# Patient Record
Sex: Male | Born: 1950 | Race: White | Hispanic: No | Marital: Married | Smoking: Never smoker
Health system: Southern US, Community
[De-identification: ages and names within clinical notes are randomized; demographics above are authoritative.]

## PROBLEM LIST (undated history)

## (undated) DIAGNOSIS — M1990 Unspecified osteoarthritis, unspecified site: Secondary | ICD-10-CM

## (undated) DIAGNOSIS — E785 Hyperlipidemia, unspecified: Secondary | ICD-10-CM

## (undated) DIAGNOSIS — Z0389 Encounter for observation for other suspected diseases and conditions ruled out: Secondary | ICD-10-CM

## (undated) DIAGNOSIS — I219 Acute myocardial infarction, unspecified: Secondary | ICD-10-CM

## (undated) DIAGNOSIS — I2510 Atherosclerotic heart disease of native coronary artery without angina pectoris: Principal | ICD-10-CM

## (undated) HISTORY — DX: Encounter for observation for other suspected diseases and conditions ruled out: Z03.89

## (undated) HISTORY — DX: Hyperlipidemia, unspecified: E78.5

## (undated) HISTORY — PX: HERNIA REPAIR: SHX51

## (undated) HISTORY — DX: Atherosclerotic heart disease of native coronary artery without angina pectoris: I25.10

---

## 2000-05-14 HISTORY — PX: LASIK: SHX215

## 2002-03-02 ENCOUNTER — Encounter: Admission: RE | Admit: 2002-03-02 | Discharge: 2002-03-02 | Payer: Self-pay | Admitting: Surgery

## 2002-03-02 ENCOUNTER — Encounter: Payer: Self-pay | Admitting: Surgery

## 2002-03-17 ENCOUNTER — Ambulatory Visit (HOSPITAL_BASED_OUTPATIENT_CLINIC_OR_DEPARTMENT_OTHER): Admission: RE | Admit: 2002-03-17 | Discharge: 2002-03-17 | Payer: Self-pay | Admitting: Surgery

## 2002-04-17 ENCOUNTER — Ambulatory Visit (HOSPITAL_COMMUNITY): Admission: RE | Admit: 2002-04-17 | Discharge: 2002-04-17 | Payer: Self-pay | Admitting: Surgery

## 2005-09-28 ENCOUNTER — Encounter: Admission: RE | Admit: 2005-09-28 | Discharge: 2005-09-28 | Payer: Self-pay | Admitting: Family Medicine

## 2007-01-16 ENCOUNTER — Emergency Department (HOSPITAL_COMMUNITY): Admission: EM | Admit: 2007-01-16 | Discharge: 2007-01-16 | Payer: Self-pay | Admitting: Emergency Medicine

## 2007-04-28 IMAGING — CR DG CHEST 2V
3 series · 3 of 3 positions shown · non-contrast
Comparison: None.

CLINICAL DATA: Cough.
 CHEST ? 2 VIEW:

[view not recorded (1 of 3)]
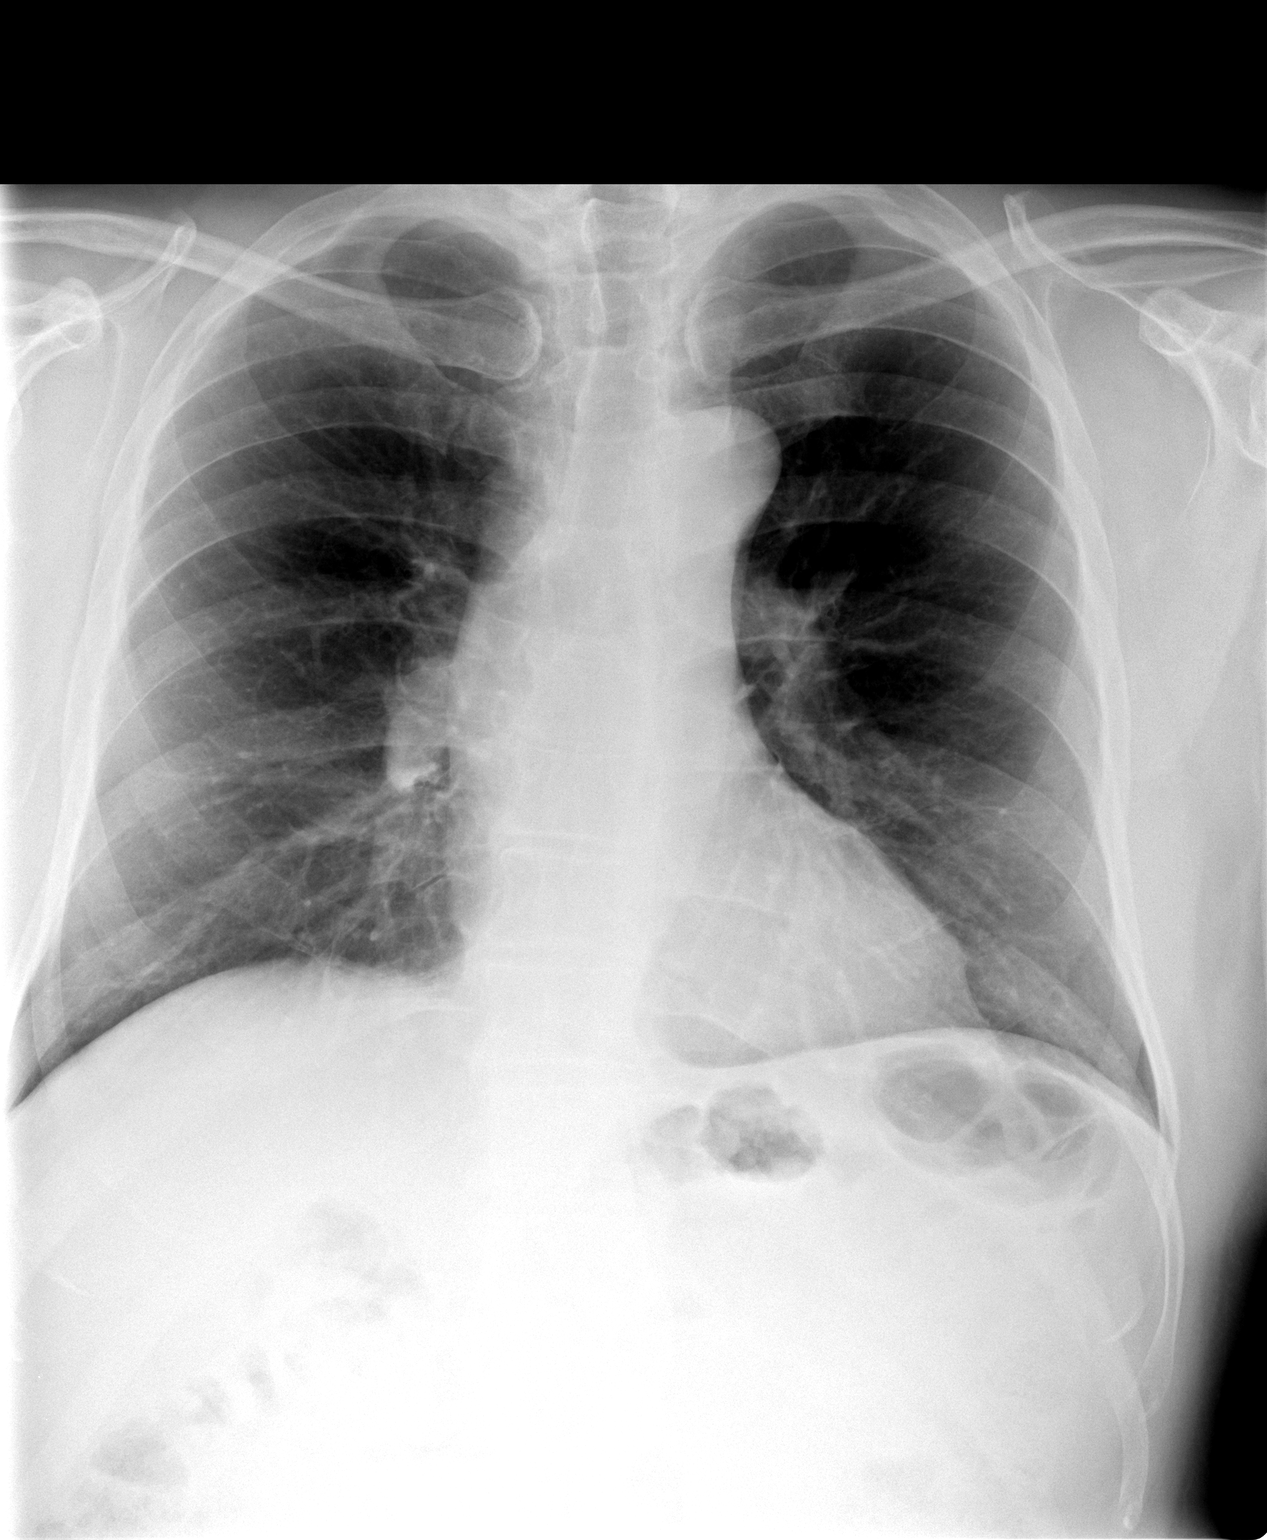

[view not recorded (2 of 3)]
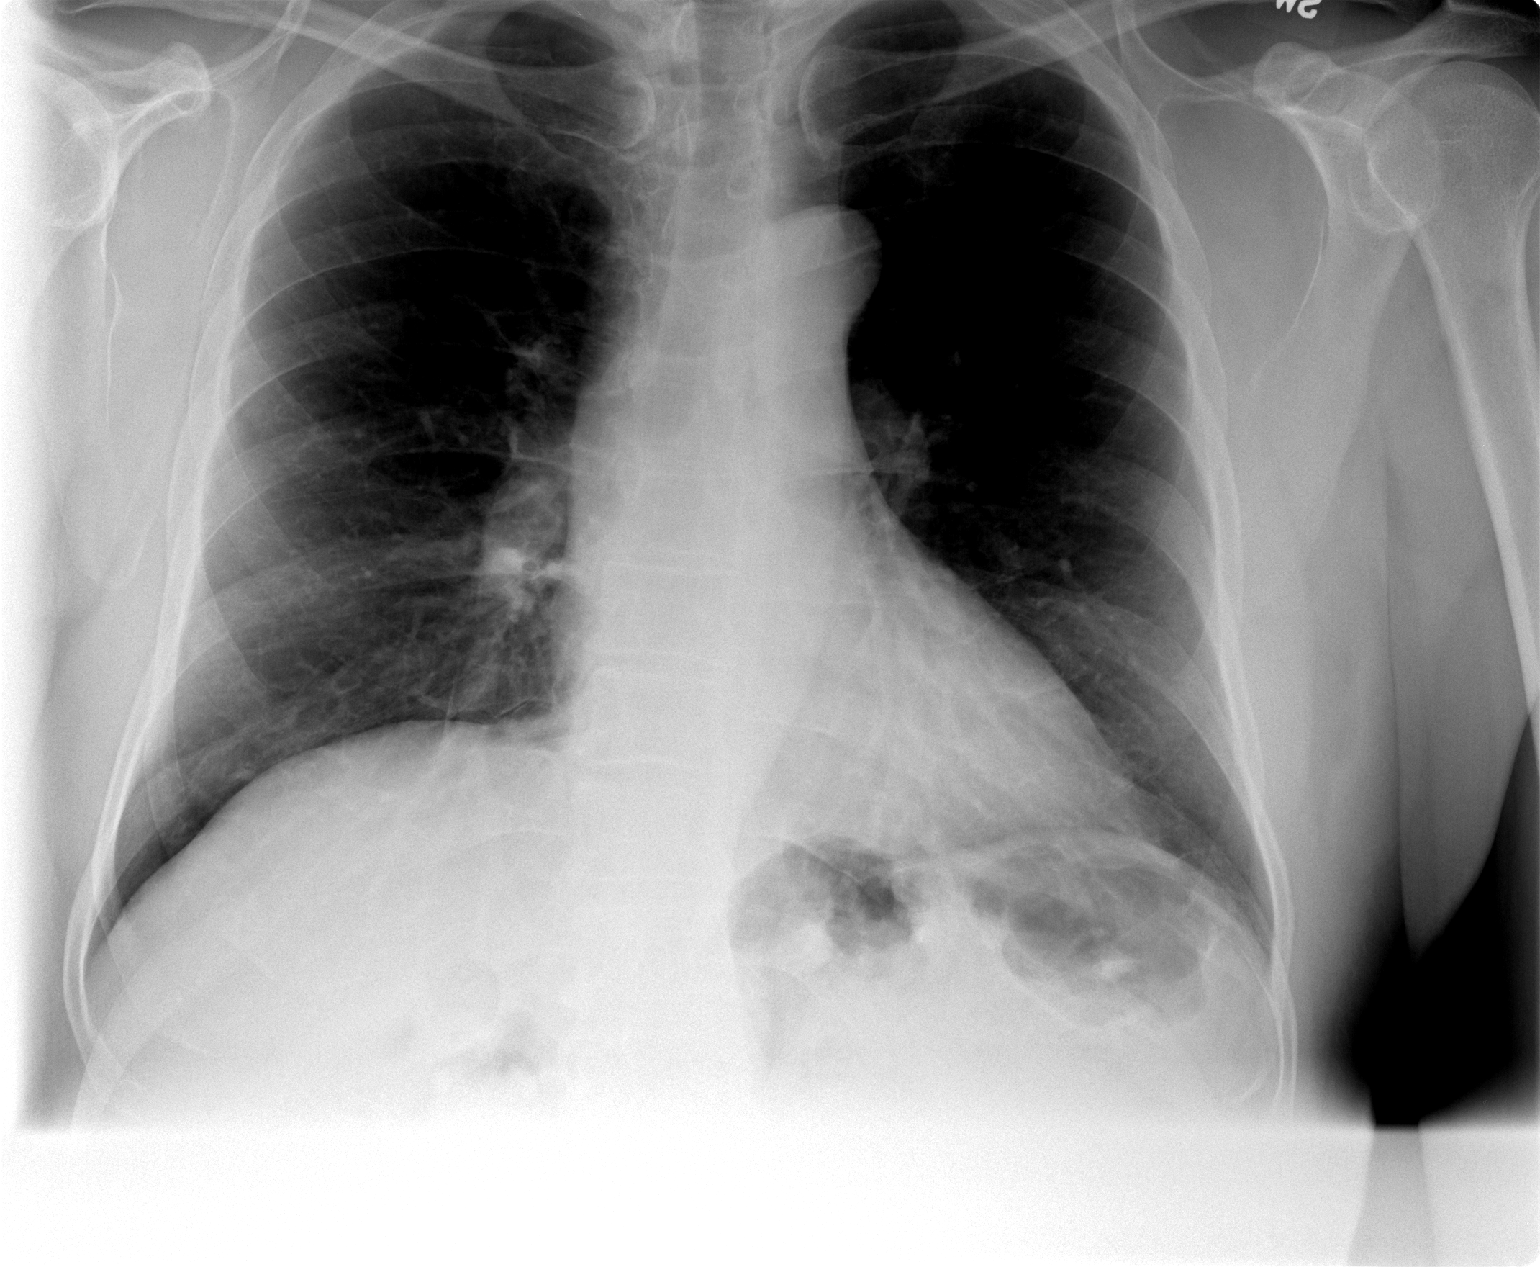

[view not recorded (3 of 3)]
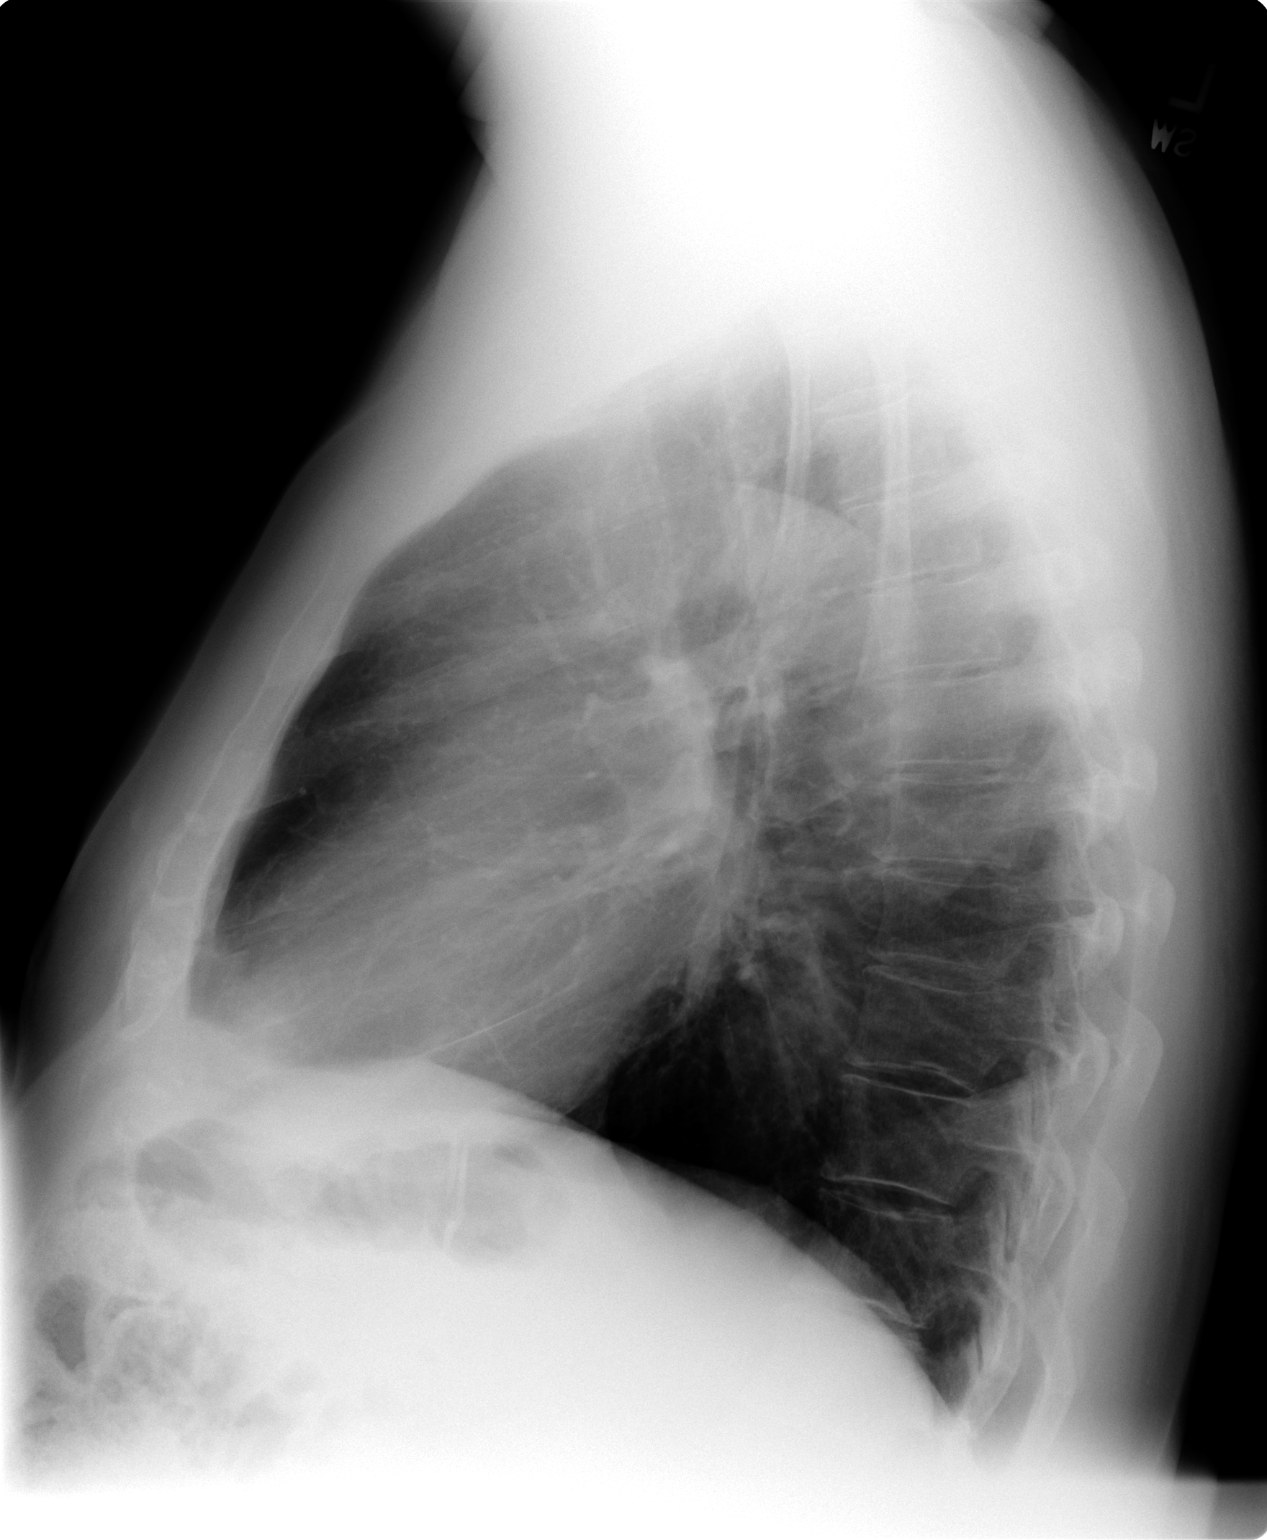

[3 of 3 positions shown; findings below may reference images not displayed]

The heart size and mediastinal contours are within normal limits.  Both lungs are clear.  The visualized skeletal structures are unremarkable.  Lungs appear somewhat hyperaerated.
IMPRESSION: Mild pulmonary hyperaeration.  No infiltrate.

## 2010-09-29 NOTE — Op Note (Signed)
NAME:  Dylan Johnson, Dylan Johnson                       ACCOUNT NO.:  [id number]   MEDICAL RECORD NO.:  [id number]                   PATIENT TYPE:  AMB   LOCATION:  DSC                                  FACILITY:  [facility]   PHYSICIAN:  Abigail Miyamoto, M.D.              DATE OF BIRTH:  14-Mar-1951   DATE OF PROCEDURE:  03/17/2002  DATE OF DISCHARGE:                                 OPERATIVE REPORT   PREOPERATIVE DIAGNOSES:  Right inguinal hernia.   POSTOPERATIVE DIAGNOSES:  Right inguinal hernia.   OPERATION PERFORMED:  Right inguinal hernia repair with mesh.   SURGEON:  Douglas A. Magnus Ivan, M.D.   ANESTHESIA:  General endotracheal and 0.25% Marcaine.   ESTIMATED BLOOD LOSS:  Minimal.   DESCRIPTION OF PROCEDURE:  The patient was brought to the operating room and  identified.  He was placed supine on the operating table and general  anesthesia was induced.  His right groin was then prepped and draped in the  usual sterile fashion.  An ilioinguinal nerve block was then performed with  0.25% Marcaine.  Next, a longitudinal incision was made in the right groin  with the 15 scalpel.  The incision was carried down through Scarpa's fascia  with electrocautery.  The external oblique fascia was then identified as  well as the external ring.  A small incision was then made with the scalpel  in the external oblique fascia.  The fascia was then opened further with the  Metzenbaum scissors.  ____________ cord and structures then identified and  controlled with a Penrose drain.  The inguinal floor was mildly weak.  No  other abnormalities were identified.  The cord was then examined and the  patient was found to have a small indirect hernia sac.  The sac was  separated from the cord structures.  It was opened and nothing was found to  be contained within the sac.  The sac was then tied off at the base with a 2-  0 silk suture.  The redundant sac was then excised.  The preperitoneal space  was then  dissected out with a Ray-Tec sponge.  A piece of Prolene mesh from  the [employer] Prolene hernia system was then brought on to the field.  The  preperitoneal portion of the mesh was then placed in the preperitoneal space  and unfolded.  The overlay portion was then opened up in the inguinal floor.  A small slit was then created in the mesh.  The mesh was then sewn in  circumferentially with interrupted 2-0 silk sutures sewing it to the pubic  tubercle, the transversalis fascia, the shelving edge of the inguinal  ligament and around the cord structures.  Excellent coverage of the inguinal  floor appeared to be achieved.  At this point the external oblique fascia  was closed with a running 2-0 Vicryl suture.  Scarpa's fascia was then  closed with interrupted 3-0 Vicryl  suture and the skin was closed with  running 4-0 Vicryl.  Steri-Strips, gauze and tape were then applied.  The  patient tolerated the procedure well.  All sponge, needle and instrument  counts were correct at the end of the procedure.  The patient was then  extubated in the operating room and taken in stable condition to the  recovery room.                                               Abigail Miyamoto, M.D.    DB/MEDQ  D:  03/17/2002  T:  03/17/2002  Job:  [id number]

## 2011-05-15 DIAGNOSIS — I219 Acute myocardial infarction, unspecified: Secondary | ICD-10-CM

## 2011-05-15 HISTORY — DX: Acute myocardial infarction, unspecified: I21.9

## 2011-06-10 ENCOUNTER — Inpatient Hospital Stay (HOSPITAL_COMMUNITY)
Admission: EM | Admit: 2011-06-10 | Discharge: 2011-06-12 | DRG: 853 | Disposition: A | Discharge status: Home / Self Care | Payer: BC Managed Care – PPO | Attending: Cardiology | Admitting: Cardiology

## 2011-06-10 ENCOUNTER — Ambulatory Visit (HOSPITAL_COMMUNITY): Admit: 2011-06-10 | Payer: Self-pay | Admitting: Cardiovascular Disease

## 2011-06-10 ENCOUNTER — Other Ambulatory Visit: Payer: Self-pay

## 2011-06-10 ENCOUNTER — Encounter (HOSPITAL_COMMUNITY): Payer: Self-pay | Admitting: *Deleted

## 2011-06-10 ENCOUNTER — Encounter (HOSPITAL_COMMUNITY)
Admission: EM | Disposition: A | Discharge status: Home / Self Care | Payer: Self-pay | Source: Home / Self Care | Attending: Cardiology

## 2011-06-10 ENCOUNTER — Emergency Department (HOSPITAL_COMMUNITY): Payer: BC Managed Care – PPO

## 2011-06-10 DIAGNOSIS — I2510 Atherosclerotic heart disease of native coronary artery without angina pectoris: Secondary | ICD-10-CM | POA: Diagnosis present

## 2011-06-10 DIAGNOSIS — Z79899 Other long term (current) drug therapy: Secondary | ICD-10-CM

## 2011-06-10 DIAGNOSIS — Z8249 Family history of ischemic heart disease and other diseases of the circulatory system: Secondary | ICD-10-CM

## 2011-06-10 DIAGNOSIS — R079 Chest pain, unspecified: Secondary | ICD-10-CM

## 2011-06-10 DIAGNOSIS — Z7982 Long term (current) use of aspirin: Secondary | ICD-10-CM

## 2011-06-10 DIAGNOSIS — Z7902 Long term (current) use of antithrombotics/antiplatelets: Secondary | ICD-10-CM

## 2011-06-10 DIAGNOSIS — I2109 ST elevation (STEMI) myocardial infarction involving other coronary artery of anterior wall: Principal | ICD-10-CM | POA: Diagnosis present

## 2011-06-10 HISTORY — DX: Unspecified osteoarthritis, unspecified site: M19.90

## 2011-06-10 HISTORY — PX: PERCUTANEOUS CORONARY STENT INTERVENTION (PCI-S): SHX5485

## 2011-06-10 HISTORY — PX: LEFT HEART CATHETERIZATION WITH CORONARY ANGIOGRAM: SHX5451

## 2011-06-10 HISTORY — PX: ANGIOPLASTY: SHX39

## 2011-06-10 HISTORY — DX: Atherosclerotic heart disease of native coronary artery without angina pectoris: I25.10

## 2011-06-10 LAB — POCT I-STAT TROPONIN I: Troponin i, poc: 0 ng/mL (ref 0.00–0.08)

## 2011-06-10 LAB — CBC
Hemoglobin: 15.6 g/dL (ref 13.0–17.0)
MCH: 29.4 pg (ref 26.0–34.0)
MCV: 84.9 fL (ref 78.0–100.0)
RBC: 5.31 MIL/uL (ref 4.22–5.81)

## 2011-06-10 LAB — CARDIAC PANEL(CRET KIN+CKTOT+MB+TROPI)
CK, MB: 85.6 ng/mL (ref 0.3–4.0)
Total CK: 924 U/L — ABNORMAL HIGH (ref 7–232)
Total CK: 1319 U/L — ABNORMAL HIGH (ref 7–232)
Troponin I: 14.19 ng/mL (ref <0.30)

## 2011-06-10 LAB — HEPATIC FUNCTION PANEL
ALT: 19 U/L (ref 0–53)
Total Protein: 7.5 g/dL (ref 6.0–8.3)

## 2011-06-10 LAB — BASIC METABOLIC PANEL
CO2: 26 mEq/L (ref 19–32)
Calcium: 9.6 mg/dL (ref 8.4–10.5)
Creatinine, Ser: 1.13 mg/dL (ref 0.50–1.35)
Glucose, Bld: 120 mg/dL — ABNORMAL HIGH (ref 70–99)

## 2011-06-10 LAB — POCT ACTIVATED CLOTTING TIME: Activated Clotting Time: 155 seconds

## 2011-06-10 LAB — PROTIME-INR: INR: 1.08 (ref 0.00–1.49)

## 2011-06-10 SURGERY — PERCUTANEOUS CORONARY STENT INTERVENTION (PCI-S)

## 2011-06-10 MED ORDER — ASPIRIN 81 MG PO CHEW: 324.0000 mg | CHEWABLE_TABLET | ORAL | Status: DC

## 2011-06-10 MED ORDER — NITROGLYCERIN 0.4 MG SL SUBL: 0.4000 mg | SUBLINGUAL_TABLET | SUBLINGUAL | Status: DC | PRN

## 2011-06-10 MED ORDER — MORPHINE SULFATE 2 MG/ML IJ SOLN: 1.0000 mg | INTRAMUSCULAR | Status: DC | PRN

## 2011-06-10 MED ORDER — ASPIRIN EC 81 MG PO TBEC: 81.0000 mg | DELAYED_RELEASE_TABLET | Freq: Every day | ORAL | Status: DC

## 2011-06-10 MED ORDER — NITROGLYCERIN 0.2 MG/ML ON CALL CATH LAB
INTRAVENOUS | Status: AC
  Filled 2011-06-10: qty 1

## 2011-06-10 MED ORDER — ACETAMINOPHEN 325 MG PO TABS: 650.0000 mg | ORAL_TABLET | ORAL | Status: DC | PRN

## 2011-06-10 MED ORDER — PRASUGREL HCL 10 MG PO TABS
60.0000 mg | ORAL_TABLET | ORAL | Status: AC
  Administered 2011-06-10: 60 mg via ORAL
  Filled 2011-06-10: qty 6

## 2011-06-10 MED ORDER — METOPROLOL TARTRATE 12.5 MG HALF TABLET
12.5000 mg | ORAL_TABLET | Freq: Two times a day (BID) | ORAL | Status: DC
  Administered 2011-06-10 – 2011-06-12 (×5): 12.5 mg via ORAL
  Filled 2011-06-10 (×6): qty 1

## 2011-06-10 MED ORDER — HEPARIN SODIUM (PORCINE) 5000 UNIT/ML IJ SOLN
4000.0000 [IU] | Freq: Once | INTRAMUSCULAR | Status: AC
  Administered 2011-06-10: 4000 [IU] via INTRAVENOUS

## 2011-06-10 MED ORDER — NITROGLYCERIN 0.4 MG SL SUBL
0.4000 mg | SUBLINGUAL_TABLET | SUBLINGUAL | Status: AC | PRN
  Administered 2011-06-10 (×3): 0.4 mg via SUBLINGUAL
  Filled 2011-06-10: qty 75

## 2011-06-10 MED ORDER — ONDANSETRON HCL 4 MG/2ML IJ SOLN: 4.0000 mg | Freq: Four times a day (QID) | INTRAMUSCULAR | Status: DC | PRN

## 2011-06-10 MED ORDER — ASPIRIN EC 325 MG PO TBEC
325.0000 mg | DELAYED_RELEASE_TABLET | Freq: Every day | ORAL | Status: DC
  Administered 2011-06-11: 325 mg via ORAL
  Filled 2011-06-10: qty 1

## 2011-06-10 MED ORDER — HEPARIN (PORCINE) IN NACL 2-0.9 UNIT/ML-% IJ SOLN
INTRAMUSCULAR | Status: AC
  Filled 2011-06-10: qty 2000

## 2011-06-10 MED ORDER — SODIUM CHLORIDE 0.9 % IV SOLN: INTRAVENOUS | Status: AC

## 2011-06-10 MED ORDER — HEPARIN SOD (PORCINE) IN D5W 100 UNIT/ML IV SOLN
1000.0000 [IU]/h | INTRAVENOUS | Status: DC
  Filled 2011-06-10: qty 250

## 2011-06-10 MED ORDER — LIDOCAINE HCL (PF) 1 % IJ SOLN
INTRAMUSCULAR | Status: AC
  Filled 2011-06-10: qty 30

## 2011-06-10 MED ORDER — NITROGLYCERIN 2 % TD OINT: 1.0000 [in_us] | TOPICAL_OINTMENT | Freq: Four times a day (QID) | TRANSDERMAL | Status: DC

## 2011-06-10 MED ORDER — METOPROLOL TARTRATE 25 MG PO TABS: 25.0000 mg | ORAL_TABLET | Freq: Two times a day (BID) | ORAL | Status: DC

## 2011-06-10 MED ORDER — ATROPINE SULFATE 1 MG/ML IJ SOLN
INTRAMUSCULAR | Status: AC
  Filled 2011-06-10: qty 1

## 2011-06-10 MED ORDER — PRASUGREL HCL 10 MG PO TABS
10.0000 mg | ORAL_TABLET | Freq: Every day | ORAL | Status: DC
  Administered 2011-06-11: 10 mg via ORAL
  Filled 2011-06-10: qty 1

## 2011-06-10 MED ORDER — ASPIRIN 300 MG RE SUPP: 300.0000 mg | RECTAL | Status: DC

## 2011-06-10 MED ORDER — BIVALIRUDIN 250 MG IV SOLR
INTRAVENOUS | Status: AC
  Filled 2011-06-10: qty 250

## 2011-06-10 MED ORDER — ASPIRIN 81 MG PO CHEW
324.0000 mg | CHEWABLE_TABLET | Freq: Once | ORAL | Status: AC
  Administered 2011-06-10: 162 mg via ORAL
  Filled 2011-06-10: qty 2

## 2011-06-10 MED ORDER — ROSUVASTATIN CALCIUM 40 MG PO TABS: 40.0000 mg | ORAL_TABLET | Freq: Every day | ORAL | Status: DC

## 2011-06-10 MED ORDER — MIDAZOLAM HCL 2 MG/2ML IJ SOLN
INTRAMUSCULAR | Status: AC
  Filled 2011-06-10: qty 2

## 2011-06-10 MED ORDER — NITROGLYCERIN IN D5W 200-5 MCG/ML-% IV SOLN: 3.0000 ug/min | INTRAVENOUS | Status: DC

## 2011-06-10 MED ORDER — ROSUVASTATIN CALCIUM 20 MG PO TABS
20.0000 mg | ORAL_TABLET | Freq: Every day | ORAL | Status: DC
  Administered 2011-06-10 – 2011-06-11 (×2): 20 mg via ORAL
  Filled 2011-06-10 (×3): qty 1

## 2011-06-10 MED ORDER — NITROGLYCERIN IN D5W 200-5 MCG/ML-% IV SOLN
2.0000 ug/min | Freq: Once | INTRAVENOUS | Status: AC
  Administered 2011-06-10: 5 ug/min via INTRAVENOUS
  Filled 2011-06-10: qty 250

## 2011-06-10 MED ORDER — SODIUM CHLORIDE 0.9 % IV SOLN
20.0000 mL | INTRAVENOUS | Status: DC
  Administered 2011-06-10: 20 mL via INTRAVENOUS

## 2011-06-10 NOTE — Progress Notes (Signed)
Cardiac cath sheath removed.  Pressure applied x 20 minutes with no signs of bleeding at termination of pressure.  Pressure dressing applied.  Pedal pulse +3.  VS stable.  Monitor shows SR.

## 2011-06-10 NOTE — ED Notes (Addendum)
Patient complaining of chest pain; states that the pain started around 8:30 pm, they went away, and then came back and awoke him from sleep around 1:30 am.  Describes location of chest pain as "mid-sternum"; states that the pain radiates all through his chest and down both arms.  Rates pain 8/10 on the numerical pain scale; describes pain as a "pressure".  Associated symptoms include hot flashes, nausea, and diaphoresis.  Patient denies dizziness, blurred vision, and numbness/tingling in hands/feet.  Denies cardiac history; patient had stress test year ago to gather baseline (no abnormal findings).  Patient took 162 mg of aspirin prior to arrival.  Patient alert and oriented x4; PERRL present. Upon arrival to room, patient connected to continuous cardiac, pulse ox, and blood pressure monitor.  Family present at bedside.  Will continue to monitor.

## 2011-06-10 NOTE — ED Notes (Signed)
Patient became diaphoretic and dizzy after second nitroglycerin tablet; withheld third nitro dose due to blood pressure dropping in the 80's systolic.  Patient given 500 mL bolus, placed cold washcloth on head, and placed patient in modified trendelenburg position -- patient states that his chest pain is subsiding, but that he still feels "flushed all over".  Dr. Lynelle Doctor notified; will continue to monitor.

## 2011-06-10 NOTE — H&P (Signed)
    Pt was reexamined and existing H & P reviewed. No changes found.  Runell Gess, MD [facility] 06/10/2011 6:24 AM

## 2011-06-10 NOTE — Progress Notes (Signed)
Subjective:  61 year old with distal RCA stent, Effient, ASA, [place]. Feels better. No SOB. Sheath still in place.  Had screening stress test 1.5 years ago which was normal.  Was at movie tonight around 830p developed substernal CP. Pain resolved and was able to finish the movie. Around 1:30 am woke with CP 7/10 and diaphoresis. Came to ER. First ECG with borderline changes suggestive of possible anterior injury current. First set CE normal. Treated with IV NTG with improvement in CP. However CP never resolved completely. F/u ECG mildly improved but borderline ST elevation in anterior leads. In [place] - prior to sched tonsillectomy - bleeding. Saw a hematologist. ?hematologic abnormality. Was told something about Factor "13". She had never seen this in her whole career. He has not had any bleeding issues since.   Objective:  Vital Signs in the last 24 hours: Temp:  [97.7 F (36.5 C)-98.2 F (36.8 C)] 98.2 F (36.8 C) (01/27 0700) Pulse Rate:  [49-70] 69  (01/27 0800) Resp:  [11-22] 17  (01/27 0800) BP: (106-149)/(60-100) 126/77 mmHg (01/27 0800) SpO2:  [97 %-100 %] 99 % (01/27 0800) Weight:  [90.719 kg (200 lb)] 90.719 kg (200 lb) (01/27 0500)  Intake/Output from previous day: 01/26 0701 - 01/27 0700 In: 75 [I.V.:75] Out: -    Physical Exam: General: Well developed, well nourished, in no acute distress. Head:  Normocephalic and atraumatic. Lungs: Clear to auscultation and percussion. Heart: Normal S1 and S2.  No murmur, rubs or gallops.  Pulses: Pulses normal in all 4 extremities. Abdomen: soft, non-tender, positive bowel sounds. Extremities: Right groin with oozing. Sheath in place. No edema. Neurologic: Alert and oriented x 3.    Lab Results:  [facility] 06/10/11 0243  WBC 8.2  HGB 15.6  PLT 215    Basename 06/10/11 0243  NA 136  K 3.6  CL 101  CO2 26  GLUCOSE 120*  BUN 16  CREATININE 1.13  Hepatic Function Panel  Basename 06/10/11 0243  PROT 7.5  ALBUMIN  3.9  AST 19  ALT 19  ALKPHOS 56  BILITOT 0.3  BILIDIR <0.1  IBILI NOT CALCULATED    Imaging: Dg Chest Portable 1 View  06/10/2011  *RADIOLOGY REPORT*  Clinical Data: Chest pain behind the sternum.  Nausea.  PORTABLE CHEST - 1 VIEW  Comparison: 09/28/2005  Findings: Borderline heart size with normal pulmonary vascularity, likely normal for technique.  No focal airspace consolidation in the lungs.  No pneumothorax.  No blunting of costophrenic angles. Tortuous aorta.  No significant change since previous study.  IMPRESSION: No evidence of active pulmonary disease.  Original Report Authenticated By: Marlon Pel, M.D.   Personally viewed.   Telemetry: Few PVC's. normal Personally viewed.   EKG:  Subtle J point elevation ant leads. Prior. This am, now, NSSTW changes in the inferior leads.  Cardiac Studies:  Cath reviewed 1. Left main; normal  2. LAD; minor irregularities to the apical LAD and the patient prior to the apex and was small  3. Left circumflex; 40% segmental stenosis in the mid AV groove just beyond a moderate-sized first marginal branch.  4. Right coronary artery; is a dominant vessel and with the culprit vessel. There were tandem 80 and 90% stenoses in the distal RCA at the crux just prior to the takeoff of a small PDA. There was a 40-50% stenosis at the genu. With what appears to be a RV branch that had a 95% ostial stenosis.  5. Left ventriculography; RAO  left ventriculogram was performed using  25 mL of Visipaque dye at 12 mL/second. The overall LVEF estimated  50 % With wall motion abnormalities significant for mild inferoapical hypokinesia  5 x 16 mm long Promus to distal dominant RCA    Assessment/Plan:  Active Problems:  Acute anterior wall MI  Acute MI - Successful PCI and stenting of the distal dominant right coronary artery using a Promus element drug-eluting stent. The patient has preserved LV function with wall motion abnormality in the corresponding to  the right coronary artery. We'll treat his acute marginal branch stenosis medically. He'll be treated with aspirin, effient. , beta blocker, and statin drugs. Crestor 20mg  added.  Watch for any signs of bleeding, ?hematologic history.  Effient for one year at least.   Family history of CAD - parents 24's MI.  If bed needed - OK to transfer to floor this evening if stable. Monitor CE's. Checking lipids.   Troponin thus far is normal. Good overall prognosis.   Critical care time - spent with family, discussion about disease course, labs, records.    Bevelyn Arriola 06/10/2011, 8:36 AM

## 2011-06-10 NOTE — ED Provider Notes (Signed)
History     CSN: [id number]  Arrival date & time 06/10/11  0156   First MD Initiated Contact with Patient 06/10/11 0226      Chief Complaint  Patient presents with  . Chest Pain    (Consider location/radiation/quality/duration/timing/severity/associated sxs/prior treatment) HPI Patient states he had an episode of chest discomfort early in the evening while they were at a movie  the that he attributed to heartburn. Symptoms resolved.  The patient states after falling asleep tonight however he woke up with chest discomfort.  Pain is located in the center of his chest. Patient states it is uncomfortable. He denies any shortness of breath or nausea.  The discomfort radiates from his chest into both arms. It's an 8/10 minutes described as a pressure. He has had some nausea and diaphoresis with it .  He does not have history of cardiac disease. He does have family history however in both his mother and father. He did have a stress test in the past that was normal. History reviewed. No pertinent past medical history.  History reviewed. No pertinent past surgical history.  History reviewed. No pertinent family history.  History  Substance Use Topics  . Smoking status: Never Smoker   . Smokeless tobacco: Not on file  . Alcohol Use: No      Review of Systems  Respiratory: Negative for cough.   Gastrointestinal: Negative for abdominal pain.  All other systems reviewed and are negative.    Allergies  Review of patient's allergies indicates no known allergies.  Home Medications   Current Outpatient Rx  Name Route Sig Dispense Refill  . ASPIRIN EC 81 MG PO TBEC Oral Take 81 mg by mouth once.      BP 140/98  Pulse 65  Temp(Src) 97.7 F (36.5 C) (Oral)  Resp 16  SpO2 100%  Physical Exam  Nursing note and vitals reviewed. Constitutional: He appears well-developed and well-nourished. No distress.  HENT:  Head: Normocephalic and atraumatic.  Right Ear: External ear normal.    Left Ear: External ear normal.  Eyes: Conjunctivae are normal. Right eye exhibits no discharge. Left eye exhibits no discharge. No scleral icterus.  Neck: Neck supple. No tracheal deviation present.  Cardiovascular: Normal rate, regular rhythm and intact distal pulses.   Pulmonary/Chest: Effort normal and breath sounds normal. No stridor. No respiratory distress. He has no wheezes. He has no rales.  Abdominal: Soft. Bowel sounds are normal. He exhibits no distension. There is no tenderness. There is no rebound and no guarding.  Musculoskeletal: He exhibits no edema and no tenderness.  Neurological: He is alert. He has normal strength. No sensory deficit. Cranial nerve deficit:  no gross defecits noted. He exhibits normal muscle tone. He displays no seizure activity. Coordination normal.  Skin: Skin is warm and dry. No rash noted.  Psychiatric: He has a normal mood and affect.    ED Course  Procedures (including critical care time)  Date: 06/10/2011  Rate: 71  Rhythm: normal sinus rhythm  QRS Axis: normal  Intervals: normal  ST/T Wave abnormalities: normal accepts increased T-wave amplitude anterior leads which are new  Conduction Disutrbances:none  Narrative Interpretation:   Old EKG Reviewed: changes noted  Labs Reviewed  BASIC METABOLIC PANEL - Abnormal; Notable for the following:    Glucose, Bld 120 (*)    GFR calc non Af Amer 69 (*)    GFR calc Af Amer 80 (*)    All other components within normal limits  CBC  PROTIME-INR  APTT  POCT I-STAT TROPONIN I  HEPATIC FUNCTION PANEL  I-STAT TROPONIN I  I-STAT TROPONIN I  I-STAT TROPONIN I  I-STAT TROPONIN I  HEPATIC FUNCTION PANEL   No results found.   Medications  aspirin EC 81 MG tablet (not administered)  0.9 %  sodium chloride infusion (20 mL Intravenous New Bag/Given 06/10/11 0331)  nitroGLYCERIN (NITROGLYN) 2 % ointment 1 inch (not administered)  nitroGLYCERIN 0.2 mg/mL in dextrose 5 % infusion (not administered)   aspirin chewable tablet 324 mg (162 mg Oral Given 06/10/11 0312)  nitroGLYCERIN (NITROSTAT) SL tablet 0.4 mg (0.4 mg Sublingual Given 06/10/11 0326)    The patient's symptoms are concerning for acute coronary spasm. He has some EKG changes that are new compared to his old EKG on file. Patient has been given aspirin, nitroglycerin. I will also start him on heparin. He had a transient episode of hypotension associated with nitroglycerin but that is better now. Patient states she's feeling significantly better although does have some chest discomfort still. Will continue nitroglycerin. I've spoken with Dr. Teressa Lower who is on call for cardiology. He will come to evaluate the patient we will continue to monitor closely in the emergency department.   CRITICAL CARE Performed by: Celene Kras   Total critical care time: 30  Critical care time was exclusive of separately billable procedures and treating other patients.  Critical care was necessary to treat or prevent imminent or life-threatening deterioration.  Critical care was time spent personally by me on the following activities: development of treatment plan with patient and/or surrogate as well as nursing, discussions with consultants, evaluation of patient's response to treatment, examination of patient, obtaining history from patient or surrogate, ordering and performing treatments and interventions, ordering and review of laboratory studies, ordering and review of radiographic studies, pulse oximetry and re-evaluation of patient's condition.         Celene Kras, MD 06/10/11 [phone]

## 2011-06-10 NOTE — ED Notes (Signed)
Patient transported to cath lab; patient on [facility]; transported by RNs and critical care RNs.

## 2011-06-10 NOTE — ED Notes (Signed)
X-ray at bedside

## 2011-06-10 NOTE — ED Notes (Signed)
Dr.Knapp at bedside  

## 2011-06-10 NOTE — ED Notes (Signed)
EKG completed and given to Dr. Knapp along with OLD ekg. 

## 2011-06-10 NOTE — ED Notes (Signed)
The pt has had chest pain tonight.  He feel asleep then woke up with the chest pain again

## 2011-06-10 NOTE — H&P (Signed)
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 Strong family Hx of CAD with both parents dying of heart disease.  Had screening stress test 1.5 years ago which was normal.   Was at movie tonight around 830p developed substernal CP. Pain resolved and was able to finish the movie. Around 1:30 am woke with CP 7/10 and diaphoresis. Came to ER. First ECG with borderline changes suggestive of possible anterior injury current. First set CE normal. Treated with IV NTG with improvement in CP. However CP never resolved completely. F/u ECG mildly improved but borderline ST elevation in anterior leads.  Treated with ASA, heparin and NTG.   Denies bleeding/fevers/chills/HF.    Review of Systems:     Cardiac Review of Systems: {Y] = yes [ ]  = no  Chest Pain [  Y  ]  Resting SOB [   ] Exertional SOB  [  ]  Orthopnea [  ]   Pedal Edema [   ]    Palpitations [  ] Syncope  [  ]   Presyncope [   ]  General Review of Systems: [Y] = yes [  ]=no Constitional: recent weight change [  ]; anorexia [  ]; fatigue [  ]; nausea [  ]; night sweats [  ]; fever [  ]; or chills [  ];                                                                                                                                          Eye : blurred vision [  ]; diplopia [   ]; vision changes [  ];  Amaurosis fugax[  ]; Resp: cough [  ];  wheezing[  ];  hemoptysis[  ]; shortness of breath[  ]; paroxysmal nocturnal dyspnea[  ]; dyspnea on exertion[  ]; or orthopnea[  ];  GI:  gallstones[  ], vomiting[  ];  dysphagia[  ]; melena[  ];  hematochezia [  ]; heartburn[  ];   Hx of  Colonoscopy[  ]; GU: kidney stones [  ]; hematuria[  ];   dysuria [  ];  nocturia[  ];  history of     obstruction [  ];                 Skin: rash, swelling[  ];, hair loss[  ];  peripheral edema[  ];  or itching[  ]; Musculosketetal: myalgias[  ];  joint swelling[  ];  joint erythema[  ];  joint pain[  ];  back pain[  ];  Heme/Lymph:  bruising[  ];  bleeding[  ];  anemia[  ];  Neuro: TIA[  ];  headaches[  ];  stroke[  ];  vertigo[  ];  seizures[  ];   paresthesias[  ];  difficulty walking[  ];  Psych:depression[  ]; anxiety[  ];  Endocrine: diabetes[  ];  thyroid dysfunction[  ];   Other:  History reviewed. No pertinent past medical history.  Medications Prior to Admission  Medication Dose Route Frequency Provider Last Rate Last Dose  . 0.9 %  sodium chloride infusion  20 mL Intravenous Continuous Celene Kras, MD 20 mL/hr at 06/10/11 0331 20 mL at 06/10/11 0331  . aspirin chewable tablet 324 mg  324 mg Oral Once Celene Kras, MD   162 mg at 06/10/11 1610  . nitroGLYCERIN (NITROSTAT) SL tablet 0.4 mg  0.4 mg Sublingual Q5 min PRN Celene Kras, MD   0.4 mg at 06/10/11 0326  . nitroGLYCERIN 0.2 mg/mL in dextrose 5 % infusion  2-200 mcg/min Intravenous Once Celene Kras, MD 1.5 mL/hr at 06/10/11 0358 5 mcg/min at 06/10/11 0358  . DISCONTD: nitroGLYCERIN (NITROGLYN) 2 % ointment 1 inch  1 inch Topical Q6H Celene Kras, MD       No current outpatient prescriptions on file as of 06/10/2011.     No Known Allergies  History   Social History  . Marital Status: Married    Spouse Name: N/A    Number of Children: N/A  . Years of Education: N/A   Occupational History  . Not on file.   Social History Main Topics  . Smoking status: Never Smoker   . Smokeless tobacco: Not on file  . Alcohol Use: No  . Drug Use:   . Sexually Active:    Other Topics Concern  . Not on file   Social History Narrative  . No narrative on file    History reviewed. No pertinent family history.  PHYSICAL EXAM: Filed Vitals:   06/10/11 0400  BP: 116/75  Pulse: 59  Temp:   Resp: 12   General:  Well appearing but mildly uncomfortable No respiratory difficulty HEENT: normal Neck: supple. no JVD. Carotids 2+ bilat; no bruits. No lymphadenopathy or thryomegaly appreciated. Cor: PMI nondisplaced. Regular rate & rhythm. No rubs, gallops or  murmurs. Lungs: clear Abdomen: soft, nontender, nondistended. No hepatosplenomegaly. No bruits or masses. Good bowel sounds. Extremities: no cyanosis, clubbing, rash, edema Neuro: alert & oriented x 3, cranial nerves grossly intact. moves all 4 extremities w/o difficulty. Affect pleasant.  ECG: NSR 71 with borderline ST elevation anteriorly  Results for orders placed during the hospital encounter of 06/10/11 (from the past 24 hour(s))  CBC     Status: Normal   Collection Time   06/10/11  2:43 AM      Component Value Range   WBC 8.2  4.0 - 10.5 (K/uL)   RBC 5.31  4.22 - 5.81 (MIL/uL)   Hemoglobin 15.6  13.0 - 17.0 (g/dL)   HCT 96.0  45.4 - 09.8 (%)   MCV 84.9  78.0 - 100.0 (fL)   MCH 29.4  26.0 - 34.0 (pg)   MCHC 34.6  30.0 - 36.0 (g/dL)   RDW 11.9  14.7 - 82.9 (%)   Platelets 215  150 - 400 (K/uL)  BASIC METABOLIC PANEL     Status: Abnormal   Collection Time   06/10/11  2:43 AM      Component Value Range   Sodium 136  135 - 145 (mEq/L)   Potassium 3.6  3.5 - 5.1 (mEq/L)   Chloride 101  96 - 112 (mEq/L)   CO2 26  19 - 32 (mEq/L)   Glucose, Bld 120 (*) 70 - 99 (mg/dL)   BUN 16  6 - 23 (  mg/dL)   Creatinine, Ser 9.81  0.50 - 1.35 (mg/dL)   Calcium 9.6  8.4 - 19.1 (mg/dL)   GFR calc non Af Amer 69 (*) >90 (mL/min)   GFR calc Af Amer 80 (*) >90 (mL/min)  HEPATIC FUNCTION PANEL     Status: Normal   Collection Time   06/10/11  2:43 AM      Component Value Range   Total Protein 7.5  6.0 - 8.3 (g/dL)   Albumin 3.9  3.5 - 5.2 (g/dL)   AST 19  0 - 37 (U/L)   ALT 19  0 - 53 (U/L)   Alkaline Phosphatase 56  39 - 117 (U/L)   Total Bilirubin 0.3  0.3 - 1.2 (mg/dL)   Bilirubin, Direct <4.7  0.0 - 0.3 (mg/dL)   Indirect Bilirubin NOT CALCULATED  0.3 - 0.9 (mg/dL)  POCT I-STAT TROPONIN I     Status: Normal   Collection Time   06/10/11  2:57 AM      Component Value Range   Troponin i, poc 0.00  0.00 - 0.08 (ng/mL)   Comment 3           PROTIME-INR     Status: Normal   Collection Time     06/10/11  3:02 AM      Component Value Range   Prothrombin Time 14.2  11.6 - 15.2 (seconds)   INR 1.08  0.00 - 1.49   APTT     Status: Normal   Collection Time   06/10/11  3:02 AM      Component Value Range   aPTT 35  24 - 37 (seconds)   Dg Chest Portable 1 View  06/10/2011  *RADIOLOGY REPORT*  Clinical Data: Chest pain behind the sternum.  Nausea.  PORTABLE CHEST - 1 VIEW  Comparison: 09/28/2005  Findings: Borderline heart size with normal pulmonary vascularity, likely normal for technique.  No focal airspace consolidation in the lungs.  No pneumothorax.  No blunting of costophrenic angles. Tortuous aorta.  No significant change since previous study.  IMPRESSION: No evidence of active pulmonary disease.  Original Report Authenticated By: Marlon Pel, M.D.     ASSESSMENT:  1. CP/USA   PLAN/DISCUSSION:  ECG changes are borderline but suggest possible anterior injury current. Given ongoing symptoms, will proceed with urgent cath. I discussed this with him and his family and he is willing to proceed. CODE STEMI activated. Will give Effient 60 mg load now.   Lenus Ryken Paschal,MD 4:40 AM

## 2011-06-10 NOTE — ED Notes (Signed)
Cath lab called; states that they are ready for patient.  Preparing patient for transport. 

## 2011-06-10 NOTE — Op Note (Signed)
Dylan Johnson is a 61 y.o. male    [id number] LOCATION:  FACILITY: [facility]  PHYSICIAN: Nanetta Batty, M.D. [birth year]   DATE OF PROCEDURE:  06/10/2011  DATE OF DISCHARGE:  [facility]  CARDIAC CATHETERIZATION     History obtained from chart review. 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 Strong family Hx of CAD with both parents dying of heart disease.  Had screening stress test 1.5 years ago which was normal.  Was at movie tonight around 830p developed substernal CP. Pain resolved and was able to finish the movie. Around 1:30 am woke with CP 7/10 and diaphoresis. Came to ER. First ECG with borderline changes suggestive of possible anterior injury current. First set CE normal. Treated with IV NTG with improvement in CP. However CP never resolved completely. F/u ECG mildly improved but borderline ST elevation in anterior leads.    PROCEDURE DESCRIPTION:    The patient was brought to the second floor  [facility] lab in the postabsorptive state. He was  premedicated with 0.5 mg of Versed IV. His right groin was prepped and shaved in usual sterile fashion. Xylocaine 1% was used  for local anesthesia. A 6 French sheath was inserted into the right common femoral  artery using standard Seldinger technique. The 6 French right and left Judkins diagnostic catheters along with a 6 French pigtail catheter were used for selective coronary angiography and left ventriculography respectively. Visipaque dye was used for the entirety of the case. Retrograde aortic, left ventricular end pullback pressures were recorded.    HEMODYNAMICS:    AO SYSTOLIC/AO DIASTOLIC: 140/80   LV SYSTOLIC/LV DIASTOLIC: 146/16  ANGIOGRAPHIC RESULTS:   1. Left main; normal  2. LAD; minor irregularities to the apical LAD and the patient prior to the apex and was small 3. Left circumflex; 40% segmental stenosis in the mid AV groove just beyond a moderate-sized first  marginal branch.  4. Right coronary artery; is a dominant vessel and with the culprit vessel. There were tandem 80 and 90% stenoses in the distal RCA at the crux just prior to the takeoff of a small PDA. There was a 40-50% stenosis at the genu. With what appears to be a RV branch that had a 95% ostial stenosis. 5. Left ventriculography; RAO left ventriculogram was performed using  25 mL of Visipaque dye at 12 mL/second. The overall LVEF estimated  50 %  With wall motion abnormalities significant for mild inferoapical hypokinesia.  IMPRESSION:Dylan Johnson. Has unstable angina with suggestive EKG changes in the anterior leads though his him angiogram revealed high-grade distal dominant RCA stenosis. We'll proceed with PCI and stenting of his distal right coronary artery using a drug-eluting stent, Angiomax and effient.  Procedure description:. The patient received Intimax bolus and ACT of 457. Symmetric the patient was 150 cc. Because of the upgoing gait nature of the origin of the dominant right coronary artery a 6 French shepherd's crook guide catheter was used. A 190 cm length 014 a saw he soft guidewire was used along with a 2.0 x 12 mm long immerge angioplasty balloon. Predilatation was performed at nominal pressures. Following this a 5 x 16 mm long Promus element juggling stent was then carefully positioned and deployed at 16 atmospheres. Following this post dilatation was performed with a 2.75 mm x 12 millimeter long [place] trekat 16 atmospheres (2.8 mm) resulting reduction of a 90% stenosis to 0% residual without dissection. 200 mcg of intra-coronary nitroglycerin which was administered.  There was spasm in the proximal portion which resolved. The final antibiotic result result with reduction of a distal 90% dominant RCA stenosis to 0% residual without dissection and TIMI-3 flow. The patient still had residual chest at the end of the case. I elected not to intervene on the RV/acute marginal branch because  of its size.  Final impression: Successful PCI and stenting of the distal dominant right coronary artery using a Promus element drug-eluting stent. The patient has preserved LV function with wall motion abnormality in the corresponding to the right coronary artery. We'll treat his acute marginal branch stenosis medically. He'll be treated with aspirin, effient. , beta blocker, it and statin drugs. He'll be fast tracked and probably be discharged home in the next 48 hours. The sheath was then securely in place. The patient left the lab in stable condition.  Runell Gess MD, [facility] 06/10/2011 6:30 AM

## 2011-06-10 NOTE — Progress Notes (Signed)
Chaplain's Note:  Responded to code stemi page.  Prayed with pt and family in ED before transport to cath lab.  Accompanied family to waiting room and provided emotional and spiritual support.  Explained to family where pt would be taken after procedure.  Will continue to follow as needed or requested. [employer] [phone]

## 2011-06-10 NOTE — Progress Notes (Signed)
Short burst of v-tach noted while pt up to bathroom.  Pt asymptomatic and spontaneously resolved.

## 2011-06-10 NOTE — ED Notes (Signed)
Patient currently resting quietly in bed; no respiratory or acute distress noted.  Patient updated on plan of care; informed patient that we are currently waiting on cath lab to call and say that they are ready.  Patient has no other questions or concerns at this time.  Family present at bedside.  Will continue to monitor.

## 2011-06-11 LAB — CBC
HCT: 44.7 % (ref 39.0–52.0)
Hemoglobin: 14.9 g/dL (ref 13.0–17.0)
MCV: 86.1 fL (ref 78.0–100.0)
WBC: 8.9 10*3/uL (ref 4.0–10.5)

## 2011-06-11 LAB — BASIC METABOLIC PANEL
BUN: 14 mg/dL (ref 6–23)
Chloride: 105 mEq/L (ref 96–112)
GFR calc Af Amer: >90 mL/min (ref >90)
Glucose, Bld: 108 mg/dL — ABNORMAL HIGH (ref 70–99)
Potassium: 3.8 mEq/L (ref 3.5–5.1)

## 2011-06-11 LAB — LIPID PANEL
HDL: 35 mg/dL — ABNORMAL LOW (ref >39)
LDL Cholesterol: 141 mg/dL — ABNORMAL HIGH (ref 0–99)
Triglycerides: 121 mg/dL (ref <150)
VLDL: 24 mg/dL (ref 0–40)

## 2011-06-11 LAB — CARDIAC PANEL(CRET KIN+CKTOT+MB+TROPI): Total CK: 926 U/L — ABNORMAL HIGH (ref 7–232)

## 2011-06-11 MED ORDER — ASPIRIN EC 325 MG PO TBEC
325.0000 mg | DELAYED_RELEASE_TABLET | Freq: Every day | ORAL | Status: DC
  Administered 2011-06-12: 325 mg via ORAL
  Filled 2011-06-11: qty 1

## 2011-06-11 MED ORDER — PRASUGREL HCL 10 MG PO TABS
10.0000 mg | ORAL_TABLET | Freq: Every day | ORAL | Status: DC
  Administered 2011-06-12: 10 mg via ORAL
  Filled 2011-06-11: qty 1

## 2011-06-11 MED FILL — Dextrose Inj 5%: INTRAVENOUS | Qty: 50 | Status: AC

## 2011-06-11 NOTE — Progress Notes (Signed)
Subjective:  61 year old with AMI.  Yesterday few beats of NSVT, AIVR. No symptoms, no SOB. Abmulating.   Objective:  Vital Signs in the last 24 hours: Temp:  [98 F (36.7 C)-99.5 F (37.5 C)] 98 F (36.7 C) (01/28 0715) Pulse Rate:  [57-71] 68  (01/27 2210) Resp:  [11-21] 14  (01/28 0715) BP: (111-137)/(61-90) 114/70 mmHg (01/28 0700) SpO2:  [95 %-100 %] 98 % (01/28 0715)  Intake/Output from previous day: 01/27 0701 - 01/28 0700 In: 1374 [P.O.:840; I.V.:534] Out: 1503 [Urine:1502; Stool:1]   Physical Exam: General: Well developed, well nourished, in no acute distress. Head:  Normocephalic and atraumatic. Lungs: Clear to auscultation and percussion. Heart: Normal S1 and S2.  No murmur, rubs or gallops.  Pulses: Pulses normal in all 4 extremities. Abdomen: soft, non-tender, positive bowel sounds. Extremities: No clubbing or cyanosis. No edema. Groin normal. No hematoma. No bruit Neurologic: Alert and oriented x 3.    Lab Results:  Basename 06/11/11 0545 06/10/11 0243  WBC 8.9 8.2  HGB 14.9 15.6  PLT 201 215    Basename 06/10/11 2347 06/10/11 0243  NA 138 136  K 3.8 3.6  CL 105 101  CO2 24 26  GLUCOSE 108* 120*  BUN 14 16  CREATININE 0.95 1.13    Basename 06/10/11 1941 06/10/11 1350  TROPONINI >25.00* 14.19*   Hepatic Function Panel  Basename 06/10/11 0243  PROT 7.5  ALBUMIN 3.9  AST 19  ALT 19  ALKPHOS 56  BILITOT 0.3  BILIDIR <0.1  IBILI NOT CALCULATED    Basename 06/11/11 0545  CHOL 200   No results found for this basename: PROTIME in the last 72 hours  Imaging: Dg Chest Portable 1 View  06/10/2011  *RADIOLOGY REPORT*  Clinical Data: Chest pain behind the sternum.  Nausea.  PORTABLE CHEST - 1 VIEW  Comparison: 09/28/2005  Findings: Borderline heart size with normal pulmonary vascularity, likely normal for technique.  No focal airspace consolidation in the lungs.  No pneumothorax.  No blunting of costophrenic angles. Tortuous aorta.  No  significant change since previous study.  IMPRESSION: No evidence of active pulmonary disease.  Original Report Authenticated By: Marlon Pel, M.D.   Personally viewed.   Telemetry: as above Personally viewed.   EKG:  SR, TWI V3, V2, V4 - ant ischemia.  Cardiac Studies:  Cath reviewed. Distal RCA stent.    Assessment/Plan:  Acute anterior wall MI   Acute MI - Successful PCI and stenting of the distal dominant right coronary artery using a Promus element drug-eluting stent. The patient has preserved LV function with wall motion abnormality in the corresponding to the right coronary artery. We'll treat his acute marginal branch stenosis medically. He'll be treated with aspirin, effient. , beta blocker, and statin drugs. Crestor 20mg  added.  Watch for any signs of bleeding, ?hematologic history.  Effient for one year at least.  Family history of CAD - parents 37's MI.   Troponin now >25. Tolerating cardiac rehab assessment.  ECG with ant changes.   Transfer to floor today. Monitor. Hopeful dc in am.     Burl Tauzin 06/11/2011, 7:33 AM

## 2011-06-11 NOTE — Progress Notes (Signed)
CARDIAC REHAB PHASE I   PRE:  Rate/Rhythm: 66 SR  BP:  Supine:  Sitting: 125/73  Standing:    SaO2:  MODE:  Ambulation: 350 ft   POST:  Rate/Rhythem: 70  BP:  Supine:   Sitting: 138/76  Standing:    SaO2:   0900 - 1000 Pt ambulated unit steady, no c/o cp or sob. Tolerated well. Education done with understanding. Permission for phase 2.  Rosalie Doctor

## 2011-06-11 NOTE — Progress Notes (Signed)
Just got off of phone with Pharmacy who called me to make sure that I wanted to continue his Effient and ASA. There was an order to discontinue both along with other post cath medications. I was very thankful that she recognized this. This occurred in his transfer from CCU to floor. She is going to make sure that he is absolutely continued on these medications. I at no point ordered its discontinuation. Appreciate her seeing this.

## 2011-06-12 LAB — BASIC METABOLIC PANEL
Calcium: 9.6 mg/dL (ref 8.4–10.5)
Creatinine, Ser: 1.05 mg/dL (ref 0.50–1.35)
GFR calc Af Amer: 88 mL/min — ABNORMAL LOW (ref >90)
GFR calc non Af Amer: 76 mL/min — ABNORMAL LOW (ref >90)
Sodium: 137 mEq/L (ref 135–145)

## 2011-06-12 LAB — CBC
HCT: 47.1 % (ref 39.0–52.0)
MCH: 28.9 pg (ref 26.0–34.0)
MCHC: 33.5 g/dL (ref 30.0–36.0)
MCV: 86.1 fL (ref 78.0–100.0)
RDW: 13.2 % (ref 11.5–15.5)

## 2011-06-12 MED ORDER — METOPROLOL TARTRATE 12.5 MG HALF TABLET: 12.5000 mg | ORAL_TABLET | Freq: Two times a day (BID) | ORAL | Status: DC

## 2011-06-12 MED ORDER — NITROGLYCERIN 0.4 MG SL SUBL: 0.4000 mg | SUBLINGUAL_TABLET | SUBLINGUAL | Status: DC | PRN

## 2011-06-12 MED ORDER — ROSUVASTATIN CALCIUM 20 MG PO TABS: 20.0000 mg | ORAL_TABLET | Freq: Every day | ORAL | Status: DC

## 2011-06-12 MED ORDER — ASPIRIN 325 MG PO TBEC: 325.0000 mg | DELAYED_RELEASE_TABLET | Freq: Every day | ORAL | Status: AC

## 2011-06-12 MED ORDER — PRASUGREL HCL 10 MG PO TABS: 10.0000 mg | ORAL_TABLET | Freq: Every day | ORAL | Status: DC

## 2011-06-12 NOTE — Discharge Summary (Addendum)
Patient ID: CISCO KINDT MRN: [id number] DOB/AGE: [birth year] 61 y.o.  Admit date: 06/10/2011 Discharge date: 06/12/2011  Primary Discharge Diagnosis: AMI Secondary Discharge Diagnosis: FHX CAD  Significant Diagnostic Studies: Cath - 90% distal RCA/PDA, 2.75 x 16 DES placed 1. Left main; normal  2. LAD; minor irregularities to the apical LAD and the patient prior to the apex and was small  3. Left circumflex; 40% segmental stenosis in the mid AV groove just beyond a moderate-sized first marginal branch.  4. Right coronary artery; is a dominant vessel and with the culprit vessel. There were tandem 80 and 90% stenoses in the distal RCA at the crux just prior to the takeoff of a small PDA. There was a 40-50% stenosis at the genu. With what appears to be a RV branch that had a 95% ostial stenosis.  5. Left ventriculography; RAO left ventriculogram was performed using  25 mL of Visipaque dye at 12 mL/second. The overall LVEF estimated  50 % With wall motion abnormalities significant for mild inferoapical hypokinesia.      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  Had screening stress test 1.5 years ago which was normal.  Was at movie tonight around 830p developed substernal CP. Pain resolved and was able to finish the movie. Around 1:30 am woke with CP 7/10 and diaphoresis. Came to ER. First ECG with borderline changes suggestive of possible anterior injury current. First set CE normal. Treated with IV NTG with improvement in CP. However CP never resolved completely. F/u ECG mildly improved but borderline ST elevation in anterior leads.  Treated with ASA, heparin and NTG.   Denies bleeding/fevers/chills/HF.   Did well post cath, no bleeding. Groin access.   Ambulating well, no SOB, no CP. Day of STEMI - NSVT brief and AIVR. No further.   Feels well.    Discharge Exam: Blood pressure 128/75,  pulse 69, temperature 98.8 F (37.1 C), temperature source Oral, resp. rate 16, height 6' (1.829 m), weight 90.719 kg (200 lb), SpO2 98.00%.    GEN: AAO x 3, in NAD CV: no rubs, RRR, no M LUNGS: CTAB ABD: soft +BS, NT EXT: no hematoma, no bruit, 2+ distal pulses  Labs:   Lab Results  Component Value Date   WBC 9.2 06/12/2011   HGB 15.8 06/12/2011   HCT 47.1 06/12/2011   MCV 86.1 06/12/2011   PLT 219 06/12/2011     Lab 06/12/11 0615 06/10/11 0243  NA 137 --  K 3.9 --  CL 102 --  CO2 26 --  BUN 16 --  CREATININE 1.05 --  CALCIUM 9.6 --  PROT -- 7.5  BILITOT -- 0.3  ALKPHOS -- 56  ALT -- 19  AST -- 19  GLUCOSE 94 --   Lab Results  Component Value Date   CKTOTAL 926* 06/11/2011   CKMB 48.6* 06/11/2011   TROPONINI 15.70* 06/11/2011    Lab Results  Component Value Date   CHOL 200 06/11/2011   Lab Results  Component Value Date   HDL 35* 06/11/2011   Lab Results  Component Value Date   LDLCALC 141* 06/11/2011   Lab Results  Component Value Date   TRIG 121 06/11/2011   Lab Results  Component Value Date   CHOLHDL 5.7 06/11/2011   No results found for this basename: LDLDIRECT      EKG: On Day 2, TWI in anterior leads.  FOLLOW UP PLANS AND APPOINTMENTS Discharge Orders    Future Orders Please Complete By Expires   Diet - low sodium heart healthy      Increase activity slowly        Medication List  As of 06/12/2011  8:44 AM   TAKE these medications         aspirin 325 MG EC tablet   Take 1 tablet (325 mg total) by mouth daily.      metoprolol tartrate 12.5 mg Tabs   Commonly known as: LOPRESSOR   Take 0.5 tablets (12.5 mg total) by mouth 2 (two) times daily.      nitroGLYCERIN 0.4 MG SL tablet   Commonly known as: NITROSTAT   Place 1 tablet (0.4 mg total) under the tongue every 5 (five) minutes as needed for chest pain.      prasugrel 10 MG Tabs   Commonly known as: EFFIENT   Take 1 tablet (10 mg total) by mouth daily.      rosuvastatin 20 MG tablet     Commonly known as: CRESTOR   Take 1 tablet (20 mg total) by mouth daily at 6 PM.           I do not want him to go on planned family cruise in 3 weeks given timing of myocardial infarction. We discussed.   Cardiac rehab. Peak trop >25, MB 104.   BRING ALL MEDICATIONS WITH YOU TO FOLLOW UP APPOINTMENTS  Time spent with patient to include physician time: with patient, instructions, questions, labs, meds.   SignedDonato Schultz 06/12/2011, 8:44 AM

## 2011-06-28 ENCOUNTER — Encounter (HOSPITAL_COMMUNITY)
Admission: RE | Admit: 2011-06-28 | Discharge: 2011-06-28 | Disposition: A | Discharge status: Home / Self Care | Payer: BC Managed Care – PPO | Source: Ambulatory Visit | Attending: Cardiovascular Disease | Admitting: Cardiovascular Disease

## 2011-06-28 ENCOUNTER — Encounter (HOSPITAL_COMMUNITY): Payer: Self-pay

## 2011-06-28 DIAGNOSIS — I2510 Atherosclerotic heart disease of native coronary artery without angina pectoris: Secondary | ICD-10-CM | POA: Insufficient documentation

## 2011-06-28 DIAGNOSIS — Z5189 Encounter for other specified aftercare: Secondary | ICD-10-CM | POA: Insufficient documentation

## 2011-06-28 DIAGNOSIS — Z7982 Long term (current) use of aspirin: Secondary | ICD-10-CM | POA: Insufficient documentation

## 2011-06-28 DIAGNOSIS — Z9861 Coronary angioplasty status: Secondary | ICD-10-CM | POA: Insufficient documentation

## 2011-06-28 DIAGNOSIS — Z7902 Long term (current) use of antithrombotics/antiplatelets: Secondary | ICD-10-CM | POA: Insufficient documentation

## 2011-06-28 DIAGNOSIS — Z79899 Other long term (current) drug therapy: Secondary | ICD-10-CM | POA: Insufficient documentation

## 2011-06-28 DIAGNOSIS — Z8249 Family history of ischemic heart disease and other diseases of the circulatory system: Secondary | ICD-10-CM | POA: Insufficient documentation

## 2011-06-28 DIAGNOSIS — I2109 ST elevation (STEMI) myocardial infarction involving other coronary artery of anterior wall: Secondary | ICD-10-CM | POA: Insufficient documentation

## 2011-06-28 NOTE — Progress Notes (Signed)
Cardiac Rehab Medication Review  Does the patient  feel that his/her medications are working for him/her?  yes  Has the patient been experiencing any side effects to the medications prescribed?  no  Does the patient measure his/her own blood pressure or blood glucose at home?  Yes; patient reports usually BP around 120/80 mmHg   Does the patient have any problems obtaining medications due to transportation or finances?   No  Understanding of regimen: good Understanding of indications: fair Potential of compliance: good  Pharmacist comments: If blood pressure allows, may consider addition of an ACE inhibitor in the future. Patient has no complaints or concerns about his medications at this time.   Benjaman Pott, PharmD   06/28/2011   8:22 AM

## 2011-07-02 ENCOUNTER — Encounter (HOSPITAL_COMMUNITY)
Admission: RE | Admit: 2011-07-02 | Discharge: 2011-07-02 | Disposition: A | Discharge status: Home / Self Care | Payer: BC Managed Care – PPO | Source: Ambulatory Visit | Attending: Cardiovascular Disease | Admitting: Cardiovascular Disease

## 2011-07-02 ENCOUNTER — Encounter (HOSPITAL_COMMUNITY): Payer: Self-pay

## 2011-07-02 NOTE — Progress Notes (Signed)
Pt started cardiac rehab today.  Pt tolerated light exercise without difficulty.  VSS.  Telemetry-NSR.  Pt denies chest pain, dyspnea, dizziness with exercise.  Pt oriented to exercise equipment and routine.  Understanding verbalized.Will continue to monitor the patient throughout  the program.-jr,rn

## 2011-07-04 ENCOUNTER — Encounter (HOSPITAL_COMMUNITY)
Admission: RE | Admit: 2011-07-04 | Discharge: 2011-07-04 | Disposition: A | Discharge status: Home / Self Care | Payer: BC Managed Care – PPO | Source: Ambulatory Visit | Attending: Cardiovascular Disease | Admitting: Cardiovascular Disease

## 2011-07-04 NOTE — Progress Notes (Signed)
Denman George 61 y.o. male       Nutrition Screen                                                                    YES  NO Do you live in a nursing home?  X   Do you eat out more than 3 times/week?    X If yes, how many times per week do you eat out? 3  Do you have food allergies?   X If yes, what are you allergic to?  Have you gained or lost more than 10 lbs without trying?               X If yes, how much weight have you lost and over what time period?  lbs gained or lost over  weeks/month  Do you want to lose weight?    X  If yes, what is a goal weight or amount of weight you would like to lose? 10 lb  Do you eat alone most of the time?   X   Do you eat less than 2 meals/day?  X If yes, how many meals do you eat?  Do you drink more than 3 alcohol drinks/day?  X If yes, how many drinks per day?  Are you having trouble with constipation? *  X If yes, what are you doing to help relieve constipation?  Do you have financial difficulties with buying food?*    X   Are you experiencing regular nausea/ vomiting?*     X   Do you have a poor appetite? *                                        X   Do you have trouble chewing/swallowing? *   X    Pt with diagnoses of:  X Stent/ PTCA X Dyslipidemia  / HDL< 40 / LDL>70 / High TG      X %  Body fat >goal / Body Mass Index >25 X MI       Pt Risk Score   1       Diagnosis Risk Score  20       Total Risk Score   21                         High Risk               X Low Risk    HT: 71" Ht Readings from Last 1 Encounters:  06/28/11 5\' 11"  (1.803 m)    WT:   197.6 lb (89.8 kg) Wt Readings from Last 3 Encounters:  06/28/11 197 lb 15.6 oz (89.8 kg)  06/10/11 200 lb (90.719 kg)  06/10/11 200 lb (90.719 kg)     IBW 78.2 115%IBW BMI 27.6 28.6%body fat  Meds reviewed. Past Medical History  Diagnosis Date  . Arthritis        Activity level: Pt is sedentary   Wt goal: 174-186 lb ( 79.1-84.5 kg) Current tobacco use? No      Food/Drug  Interaction? No  Labs:  Lipid Panel     Component Value Date/Time   CHOL 200 06/11/2011 0545   TRIG 121 06/11/2011 0545   HDL 35* 06/11/2011 0545   CHOLHDL 5.7 06/11/2011 0545   VLDL 24 06/11/2011 0545   LDLCALC 141* 06/11/2011 0545   No results found for this basename: HGBA1C   06/12/11 Glucose 94  LDL goal: < 70      MI and > 2:      HDL, family h/o, > 60 yo male Estimated Daily Nutrition Needs for: ? wt loss  1400-1900 Kcal , Total Fat 40-50gm, Saturated Fat 10-15 gm, Trans Fat 1.4-1.9 gm,  Sodium less than 1500 mg

## 2011-07-06 ENCOUNTER — Encounter (HOSPITAL_COMMUNITY): Payer: BC Managed Care – PPO

## 2011-07-09 ENCOUNTER — Encounter (HOSPITAL_COMMUNITY)
Admission: RE | Admit: 2011-07-09 | Discharge: 2011-07-09 | Disposition: A | Discharge status: Home / Self Care | Payer: BC Managed Care – PPO | Source: Ambulatory Visit | Attending: Cardiovascular Disease | Admitting: Cardiovascular Disease

## 2011-07-11 ENCOUNTER — Encounter (HOSPITAL_COMMUNITY)
Admission: RE | Admit: 2011-07-11 | Discharge: 2011-07-11 | Disposition: A | Discharge status: Home / Self Care | Payer: BC Managed Care – PPO | Source: Ambulatory Visit | Attending: Cardiovascular Disease | Admitting: Cardiovascular Disease

## 2011-07-13 ENCOUNTER — Encounter (HOSPITAL_COMMUNITY)
Admission: RE | Admit: 2011-07-13 | Discharge: 2011-07-13 | Disposition: A | Discharge status: Home / Self Care | Payer: BC Managed Care – PPO | Source: Ambulatory Visit | Attending: Cardiovascular Disease | Admitting: Cardiovascular Disease

## 2011-07-13 DIAGNOSIS — Z79899 Other long term (current) drug therapy: Secondary | ICD-10-CM | POA: Insufficient documentation

## 2011-07-13 DIAGNOSIS — I2109 ST elevation (STEMI) myocardial infarction involving other coronary artery of anterior wall: Secondary | ICD-10-CM | POA: Insufficient documentation

## 2011-07-13 DIAGNOSIS — I2510 Atherosclerotic heart disease of native coronary artery without angina pectoris: Secondary | ICD-10-CM | POA: Insufficient documentation

## 2011-07-13 DIAGNOSIS — Z9861 Coronary angioplasty status: Secondary | ICD-10-CM | POA: Insufficient documentation

## 2011-07-13 DIAGNOSIS — Z8249 Family history of ischemic heart disease and other diseases of the circulatory system: Secondary | ICD-10-CM | POA: Insufficient documentation

## 2011-07-13 DIAGNOSIS — Z5189 Encounter for other specified aftercare: Secondary | ICD-10-CM | POA: Insufficient documentation

## 2011-07-13 DIAGNOSIS — Z7982 Long term (current) use of aspirin: Secondary | ICD-10-CM | POA: Insufficient documentation

## 2011-07-13 DIAGNOSIS — Z7902 Long term (current) use of antithrombotics/antiplatelets: Secondary | ICD-10-CM | POA: Insufficient documentation

## 2011-07-13 NOTE — Progress Notes (Signed)
Reviewed home exercise with pt today.  Pt plans to walk at home and use treadmill for exercise.  Reviewed THR, pulse, RPE, sign and symptoms, NTG use, and when to call 911 or MD.  Pt voiced understanding. Electronically signed by Harriett Sine MS on Friday July 13 2011 at 0910.

## 2011-07-16 ENCOUNTER — Encounter (HOSPITAL_COMMUNITY)
Admission: RE | Admit: 2011-07-16 | Discharge: 2011-07-16 | Disposition: A | Discharge status: Home / Self Care | Payer: BC Managed Care – PPO | Source: Ambulatory Visit | Attending: Cardiovascular Disease | Admitting: Cardiovascular Disease

## 2011-07-16 NOTE — Progress Notes (Signed)
Dylan Johnson 60 y.o. male Nutrition Note Spoke with pt.  Nutrition Plan and Nutrition Survey reviewed with pt. Pt is following Step 2 of the Therapeutic Lifestyle Changes diet. Pt states he was "hoping that following a heart healthy diet and exercising would result in wt loss."  Weight loss tips discussed.  Pt expressed understanding.  Nutrition Diagnosis   Food-and nutrition-related knowledge deficit related to lack of exposure to information as related to diagnosis of: ? CVD   Overweight related to excessive energy intake as evidenced by a BMI of 27.6  Nutrition RX/ Estimated Daily Nutrition Needs for: wt loss  1400-1900 Kcal, 40-50 gm fat, 10-15 gm sat fat, 1.4-1.9 gm trans-fat, <1500 mg sodium   Nutrition Intervention   Pt's individual nutrition plan including cholesterol goals reviewed with pt.   Benefits of adopting Therapeutic Lifestyle Changes discussed when Medficts reviewed.   Pt to attend the Portion Distortion class   Pt to attend the  ? Nutrition I class                          ? Nutrition II class    Pt given handouts for: ? wt loss   Continue client-centered nutrition education by RD, as part of interdisciplinary care.  Goal(s)   Pt to identify food quantities necessary to achieve: ? wt loss to a goal wt of 174-186 lb (79.1-84.5 kg) at graduation from cardiac rehab.  Monitor and Evaluate progress toward nutrition goal with team.

## 2011-07-18 ENCOUNTER — Encounter (HOSPITAL_COMMUNITY)
Admission: RE | Admit: 2011-07-18 | Discharge: 2011-07-18 | Disposition: A | Discharge status: Home / Self Care | Payer: BC Managed Care – PPO | Source: Ambulatory Visit | Attending: Cardiovascular Disease | Admitting: Cardiovascular Disease

## 2011-07-20 ENCOUNTER — Encounter (HOSPITAL_COMMUNITY)
Admission: RE | Admit: 2011-07-20 | Discharge: 2011-07-20 | Disposition: A | Discharge status: Home / Self Care | Payer: BC Managed Care – PPO | Source: Ambulatory Visit | Attending: Cardiovascular Disease | Admitting: Cardiovascular Disease

## 2011-07-23 ENCOUNTER — Encounter (HOSPITAL_COMMUNITY)
Admission: RE | Admit: 2011-07-23 | Discharge: 2011-07-23 | Disposition: A | Discharge status: Home / Self Care | Payer: BC Managed Care – PPO | Source: Ambulatory Visit | Attending: Cardiovascular Disease | Admitting: Cardiovascular Disease

## 2011-07-25 ENCOUNTER — Encounter (HOSPITAL_COMMUNITY)
Admission: RE | Admit: 2011-07-25 | Discharge: 2011-07-25 | Disposition: A | Discharge status: Home / Self Care | Payer: BC Managed Care – PPO | Source: Ambulatory Visit | Attending: Cardiovascular Disease | Admitting: Cardiovascular Disease

## 2011-07-27 ENCOUNTER — Encounter (HOSPITAL_COMMUNITY)
Admission: RE | Admit: 2011-07-27 | Discharge: 2011-07-27 | Disposition: A | Discharge status: Home / Self Care | Payer: BC Managed Care – PPO | Source: Ambulatory Visit | Attending: Cardiovascular Disease | Admitting: Cardiovascular Disease

## 2011-07-30 ENCOUNTER — Encounter (HOSPITAL_COMMUNITY)
Admission: RE | Admit: 2011-07-30 | Discharge: 2011-07-30 | Disposition: A | Discharge status: Home / Self Care | Payer: BC Managed Care – PPO | Source: Ambulatory Visit | Attending: Cardiovascular Disease | Admitting: Cardiovascular Disease

## 2011-08-01 ENCOUNTER — Encounter (HOSPITAL_COMMUNITY)
Admission: RE | Admit: 2011-08-01 | Discharge: 2011-08-01 | Disposition: A | Discharge status: Home / Self Care | Payer: BC Managed Care – PPO | Source: Ambulatory Visit | Attending: Cardiovascular Disease | Admitting: Cardiovascular Disease

## 2011-08-03 ENCOUNTER — Encounter (HOSPITAL_COMMUNITY)
Admission: RE | Admit: 2011-08-03 | Discharge: 2011-08-03 | Disposition: A | Discharge status: Home / Self Care | Payer: BC Managed Care – PPO | Source: Ambulatory Visit | Attending: Cardiovascular Disease | Admitting: Cardiovascular Disease

## 2011-08-06 ENCOUNTER — Encounter (HOSPITAL_COMMUNITY)
Admission: RE | Admit: 2011-08-06 | Discharge: 2011-08-06 | Disposition: A | Discharge status: Home / Self Care | Payer: BC Managed Care – PPO | Source: Ambulatory Visit | Attending: Cardiovascular Disease | Admitting: Cardiovascular Disease

## 2011-08-08 ENCOUNTER — Encounter (HOSPITAL_COMMUNITY)
Admission: RE | Admit: 2011-08-08 | Discharge: 2011-08-08 | Disposition: A | Discharge status: Home / Self Care | Payer: BC Managed Care – PPO | Source: Ambulatory Visit | Attending: Cardiovascular Disease | Admitting: Cardiovascular Disease

## 2011-08-10 ENCOUNTER — Encounter (HOSPITAL_COMMUNITY)
Admission: RE | Admit: 2011-08-10 | Discharge: 2011-08-10 | Disposition: A | Discharge status: Home / Self Care | Payer: BC Managed Care – PPO | Source: Ambulatory Visit | Attending: Cardiovascular Disease | Admitting: Cardiovascular Disease

## 2011-08-13 ENCOUNTER — Encounter (HOSPITAL_COMMUNITY): Payer: BC Managed Care – PPO

## 2011-08-15 ENCOUNTER — Encounter (HOSPITAL_COMMUNITY): Payer: BC Managed Care – PPO

## 2011-08-17 ENCOUNTER — Encounter (HOSPITAL_COMMUNITY): Payer: BC Managed Care – PPO

## 2011-08-20 ENCOUNTER — Encounter (HOSPITAL_COMMUNITY)
Admission: RE | Admit: 2011-08-20 | Discharge: 2011-08-20 | Disposition: A | Discharge status: Home / Self Care | Payer: BC Managed Care – PPO | Source: Ambulatory Visit | Attending: Cardiovascular Disease | Admitting: Cardiovascular Disease

## 2011-08-20 DIAGNOSIS — I2510 Atherosclerotic heart disease of native coronary artery without angina pectoris: Secondary | ICD-10-CM | POA: Insufficient documentation

## 2011-08-20 DIAGNOSIS — Z7982 Long term (current) use of aspirin: Secondary | ICD-10-CM | POA: Insufficient documentation

## 2011-08-20 DIAGNOSIS — Z8249 Family history of ischemic heart disease and other diseases of the circulatory system: Secondary | ICD-10-CM | POA: Insufficient documentation

## 2011-08-20 DIAGNOSIS — Z79899 Other long term (current) drug therapy: Secondary | ICD-10-CM | POA: Insufficient documentation

## 2011-08-20 DIAGNOSIS — Z5189 Encounter for other specified aftercare: Secondary | ICD-10-CM | POA: Insufficient documentation

## 2011-08-20 DIAGNOSIS — Z7902 Long term (current) use of antithrombotics/antiplatelets: Secondary | ICD-10-CM | POA: Insufficient documentation

## 2011-08-20 DIAGNOSIS — Z9861 Coronary angioplasty status: Secondary | ICD-10-CM | POA: Insufficient documentation

## 2011-08-20 DIAGNOSIS — I2109 ST elevation (STEMI) myocardial infarction involving other coronary artery of anterior wall: Secondary | ICD-10-CM | POA: Insufficient documentation

## 2011-08-22 ENCOUNTER — Encounter (HOSPITAL_COMMUNITY)
Admission: RE | Admit: 2011-08-22 | Discharge: 2011-08-22 | Disposition: A | Discharge status: Home / Self Care | Payer: BC Managed Care – PPO | Source: Ambulatory Visit | Attending: Cardiovascular Disease | Admitting: Cardiovascular Disease

## 2011-08-23 HISTORY — PX: NM MYOCAR PERF WALL MOTION: HXRAD629

## 2011-08-23 HISTORY — PX: TRANSTHORACIC ECHOCARDIOGRAM: SHX275

## 2011-08-24 ENCOUNTER — Encounter (HOSPITAL_COMMUNITY)
Admission: RE | Admit: 2011-08-24 | Discharge: 2011-08-24 | Disposition: A | Discharge status: Home / Self Care | Payer: BC Managed Care – PPO | Source: Ambulatory Visit | Attending: Cardiovascular Disease | Admitting: Cardiovascular Disease

## 2011-08-27 ENCOUNTER — Encounter (HOSPITAL_COMMUNITY)
Admission: RE | Admit: 2011-08-27 | Discharge: 2011-08-27 | Disposition: A | Discharge status: Home / Self Care | Payer: BC Managed Care – PPO | Source: Ambulatory Visit | Attending: Cardiovascular Disease | Admitting: Cardiovascular Disease

## 2011-08-29 ENCOUNTER — Encounter (HOSPITAL_COMMUNITY)
Admission: RE | Admit: 2011-08-29 | Discharge: 2011-08-29 | Disposition: A | Discharge status: Home / Self Care | Payer: BC Managed Care – PPO | Source: Ambulatory Visit | Attending: Cardiovascular Disease | Admitting: Cardiovascular Disease

## 2011-08-31 ENCOUNTER — Encounter (HOSPITAL_COMMUNITY)
Admission: RE | Admit: 2011-08-31 | Discharge: 2011-08-31 | Disposition: A | Discharge status: Home / Self Care | Payer: BC Managed Care – PPO | Source: Ambulatory Visit | Attending: Cardiovascular Disease | Admitting: Cardiovascular Disease

## 2011-09-03 ENCOUNTER — Encounter (HOSPITAL_COMMUNITY)
Admission: RE | Admit: 2011-09-03 | Discharge: 2011-09-03 | Disposition: A | Discharge status: Home / Self Care | Payer: BC Managed Care – PPO | Source: Ambulatory Visit | Attending: Cardiovascular Disease | Admitting: Cardiovascular Disease

## 2011-09-05 ENCOUNTER — Encounter (HOSPITAL_COMMUNITY)
Admission: RE | Admit: 2011-09-05 | Discharge: 2011-09-05 | Disposition: A | Discharge status: Home / Self Care | Payer: BC Managed Care – PPO | Source: Ambulatory Visit | Attending: Cardiovascular Disease | Admitting: Cardiovascular Disease

## 2011-09-07 ENCOUNTER — Encounter (HOSPITAL_COMMUNITY)
Admission: RE | Admit: 2011-09-07 | Discharge: 2011-09-07 | Disposition: A | Discharge status: Home / Self Care | Payer: BC Managed Care – PPO | Source: Ambulatory Visit | Attending: Cardiovascular Disease | Admitting: Cardiovascular Disease

## 2011-09-10 ENCOUNTER — Encounter (HOSPITAL_COMMUNITY)
Admission: RE | Admit: 2011-09-10 | Discharge: 2011-09-10 | Disposition: A | Discharge status: Home / Self Care | Payer: BC Managed Care – PPO | Source: Ambulatory Visit | Attending: Cardiovascular Disease | Admitting: Cardiovascular Disease

## 2011-09-12 ENCOUNTER — Encounter (HOSPITAL_COMMUNITY)
Admission: RE | Admit: 2011-09-12 | Discharge: 2011-09-12 | Disposition: A | Discharge status: Home / Self Care | Payer: BC Managed Care – PPO | Source: Ambulatory Visit | Attending: Cardiovascular Disease | Admitting: Cardiovascular Disease

## 2011-09-12 DIAGNOSIS — Z7982 Long term (current) use of aspirin: Secondary | ICD-10-CM | POA: Insufficient documentation

## 2011-09-12 DIAGNOSIS — Z79899 Other long term (current) drug therapy: Secondary | ICD-10-CM | POA: Insufficient documentation

## 2011-09-12 DIAGNOSIS — Z9861 Coronary angioplasty status: Secondary | ICD-10-CM | POA: Insufficient documentation

## 2011-09-12 DIAGNOSIS — Z7902 Long term (current) use of antithrombotics/antiplatelets: Secondary | ICD-10-CM | POA: Insufficient documentation

## 2011-09-12 DIAGNOSIS — Z5189 Encounter for other specified aftercare: Secondary | ICD-10-CM | POA: Insufficient documentation

## 2011-09-12 DIAGNOSIS — Z8249 Family history of ischemic heart disease and other diseases of the circulatory system: Secondary | ICD-10-CM | POA: Insufficient documentation

## 2011-09-12 DIAGNOSIS — I2510 Atherosclerotic heart disease of native coronary artery without angina pectoris: Secondary | ICD-10-CM | POA: Insufficient documentation

## 2011-09-12 DIAGNOSIS — I2109 ST elevation (STEMI) myocardial infarction involving other coronary artery of anterior wall: Secondary | ICD-10-CM | POA: Insufficient documentation

## 2011-09-14 ENCOUNTER — Encounter (HOSPITAL_COMMUNITY)
Admission: RE | Admit: 2011-09-14 | Discharge: 2011-09-14 | Disposition: A | Discharge status: Home / Self Care | Payer: BC Managed Care – PPO | Source: Ambulatory Visit | Attending: Cardiovascular Disease | Admitting: Cardiovascular Disease

## 2011-09-14 NOTE — Progress Notes (Addendum)
Pt had 4 beats Vtach with frequent unifocal PVC during 1st few minutes of exercise at cardiac rehab. Pt asymptomatic, BP-120/70.  PC to Jasmine December, Dr. Thea Gist nurse. Strips faxed. Per Jasmine December, Dr. Rubie Maid advised pt to increase metoprolol 25mg  BID, may return to cardiac rehab on Monday 09/17/11 if taking increased dose of metoprolol. F/U Dr. Allyson Sabal in next 1-2 weeks.  Keep appt as scheduled 09/26/11.  Pt resting HR-42.  Jasmine December will review this finding with Dr. Rubie Maid and further advise.  She will call pt at home.  Jasmine December returned call.  Per Dr. Rubie Maid, pt advised to continue current regimen until further advised by Dr. Allyson Sabal.  Rhythm strips and rehab reports will be reviewed by Dr. Allyson Sabal on Monday 09/17/11, pt not to return to rehab until advised per Dr. Allyson Sabal. Dr. Hazle Coca office spoke to pt to advise of above.

## 2011-09-17 ENCOUNTER — Encounter (HOSPITAL_COMMUNITY): Payer: BC Managed Care – PPO

## 2011-09-18 ENCOUNTER — Telehealth (HOSPITAL_COMMUNITY): Payer: Self-pay | Admitting: Cardiac Rehabilitation

## 2011-09-19 ENCOUNTER — Encounter (HOSPITAL_COMMUNITY)
Admission: RE | Admit: 2011-09-19 | Discharge: 2011-09-19 | Disposition: A | Discharge status: Home / Self Care | Payer: BC Managed Care – PPO | Source: Ambulatory Visit | Attending: Cardiovascular Disease | Admitting: Cardiovascular Disease

## 2011-09-21 ENCOUNTER — Encounter (HOSPITAL_COMMUNITY)
Admission: RE | Admit: 2011-09-21 | Discharge: 2011-09-21 | Disposition: A | Discharge status: Home / Self Care | Payer: BC Managed Care – PPO | Source: Ambulatory Visit | Attending: Cardiovascular Disease | Admitting: Cardiovascular Disease

## 2011-09-24 ENCOUNTER — Encounter (HOSPITAL_COMMUNITY)
Admission: RE | Admit: 2011-09-24 | Discharge: 2011-09-24 | Disposition: A | Discharge status: Home / Self Care | Payer: BC Managed Care – PPO | Source: Ambulatory Visit | Attending: Cardiovascular Disease | Admitting: Cardiovascular Disease

## 2011-09-26 ENCOUNTER — Encounter (HOSPITAL_COMMUNITY)
Admission: RE | Admit: 2011-09-26 | Discharge: 2011-09-26 | Disposition: A | Discharge status: Home / Self Care | Payer: BC Managed Care – PPO | Source: Ambulatory Visit | Attending: Cardiovascular Disease | Admitting: Cardiovascular Disease

## 2011-09-26 NOTE — Progress Notes (Signed)
Pt has office visit with Dr.Berry today.  Rehab report and rhythm strips with VTach and sinus brady sent with pt to take to appt.

## 2011-09-28 ENCOUNTER — Encounter (HOSPITAL_COMMUNITY)
Admission: RE | Admit: 2011-09-28 | Discharge: 2011-09-28 | Disposition: A | Discharge status: Home / Self Care | Payer: BC Managed Care – PPO | Source: Ambulatory Visit | Attending: Cardiovascular Disease | Admitting: Cardiovascular Disease

## 2011-10-01 ENCOUNTER — Encounter (HOSPITAL_COMMUNITY)
Admission: RE | Admit: 2011-10-01 | Discharge: 2011-10-01 | Disposition: A | Discharge status: Home / Self Care | Payer: BC Managed Care – PPO | Source: Ambulatory Visit | Attending: Cardiovascular Disease | Admitting: Cardiovascular Disease

## 2011-10-03 ENCOUNTER — Encounter (HOSPITAL_COMMUNITY)
Admission: RE | Admit: 2011-10-03 | Discharge: 2011-10-03 | Disposition: A | Discharge status: Home / Self Care | Payer: BC Managed Care – PPO | Source: Ambulatory Visit | Attending: Cardiovascular Disease | Admitting: Cardiovascular Disease

## 2011-10-05 ENCOUNTER — Encounter (HOSPITAL_COMMUNITY)
Admission: RE | Admit: 2011-10-05 | Discharge: 2011-10-05 | Disposition: A | Discharge status: Home / Self Care | Payer: BC Managed Care – PPO | Source: Ambulatory Visit | Attending: Cardiovascular Disease | Admitting: Cardiovascular Disease

## 2011-10-05 ENCOUNTER — Encounter (HOSPITAL_COMMUNITY): Payer: Self-pay

## 2011-11-12 NOTE — Progress Notes (Signed)
Pt successfully completed cardiac rehab attending 36/36 exercise sessions and 11 education classes.  Pt had excellent participation.  Pt VSS except significant sinus bradycardia at rest, telemetry-NSR with occasional PVC, 4 beat run of VTach on 09/14/2011.  Strips faxed to Dr. Allyson Sabal for review. Pt increased met level from 2.4-6.3.  Pt did not have hospital admission during cardiac rehab period.  Pt has made positive lifestyle changes and should be congratulated on his success.  Thank you for the referral.

## 2012-07-01 ENCOUNTER — Ambulatory Visit (HOSPITAL_COMMUNITY)
Admission: RE | Admit: 2012-07-01 | Discharge: 2012-07-01 | Disposition: A | Discharge status: Home / Self Care | Payer: BC Managed Care – PPO | Source: Ambulatory Visit | Attending: Cardiovascular Disease | Admitting: Cardiovascular Disease

## 2012-07-01 DIAGNOSIS — Z79899 Other long term (current) drug therapy: Secondary | ICD-10-CM | POA: Insufficient documentation

## 2012-07-01 DIAGNOSIS — I252 Old myocardial infarction: Secondary | ICD-10-CM | POA: Insufficient documentation

## 2012-07-01 LAB — PLATELET INHIBITION P2Y12: Platelet Function  P2Y12: 80 [PRU] — ABNORMAL LOW (ref 194–418)

## 2012-11-10 ENCOUNTER — Telehealth: Payer: Self-pay | Admitting: Cardiovascular Disease

## 2012-11-11 ENCOUNTER — Other Ambulatory Visit: Payer: Self-pay | Admitting: Cardiovascular Disease

## 2013-01-03 ENCOUNTER — Other Ambulatory Visit: Payer: Self-pay | Admitting: Cardiovascular Disease

## 2013-01-05 ENCOUNTER — Other Ambulatory Visit: Payer: Self-pay | Admitting: *Deleted

## 2013-01-07 IMAGING — CR DG CHEST 1V PORT
1 series · 1 of 1 positions shown · non-contrast
Comparison: 09/28/2005

CLINICAL DATA: Chest pain behind the sternum.  Nausea.

PORTABLE CHEST - 1 VIEW

[AP]
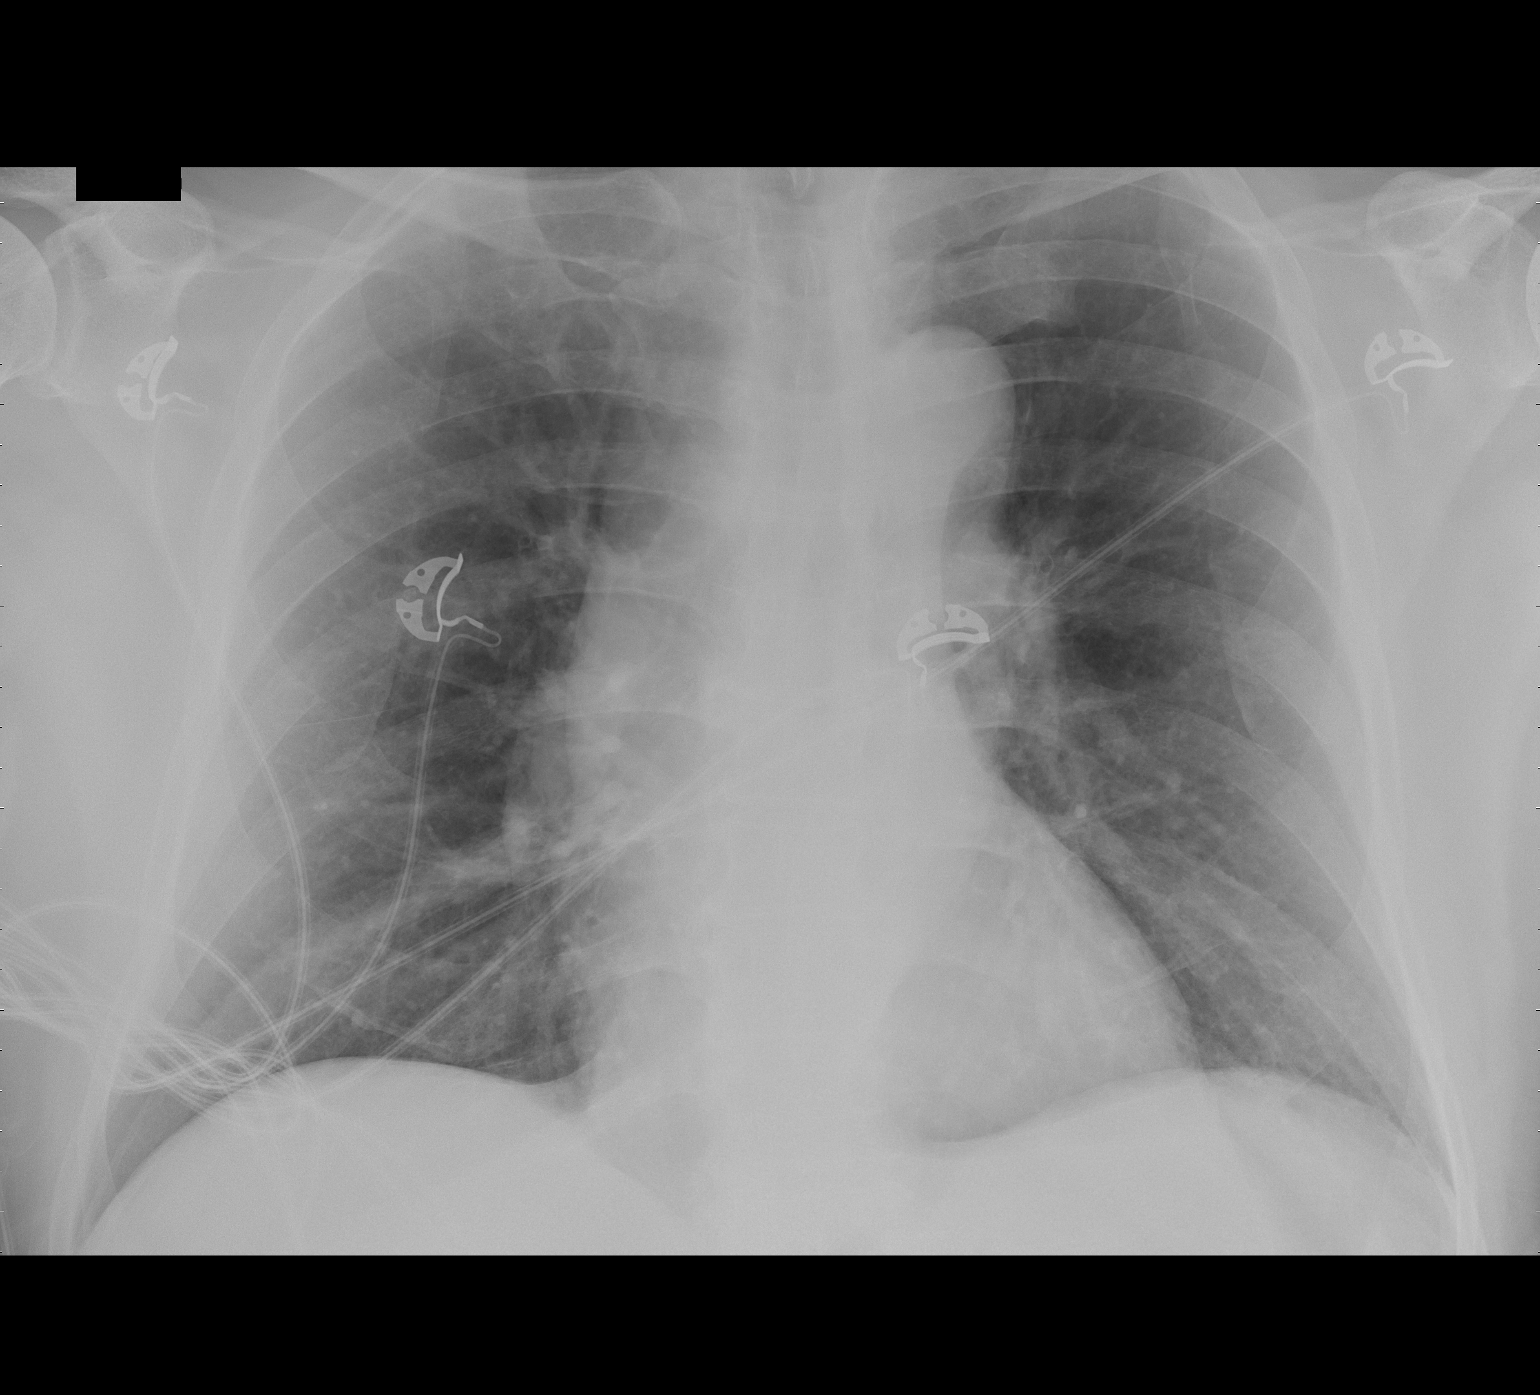

[1 of 1 positions shown; findings below may reference images not displayed]

FINDINGS: Borderline heart size with normal pulmonary vascularity,
likely normal for technique.  No focal airspace consolidation in
the lungs.  No pneumothorax.  No blunting of costophrenic angles.
Tortuous aorta.  No significant change since previous study.
IMPRESSION: No evidence of active pulmonary disease.

## 2013-01-28 ENCOUNTER — Telehealth: Payer: Self-pay | Admitting: Cardiovascular Disease

## 2013-01-28 NOTE — Telephone Encounter (Signed)
Pulled a muscle in his back-wants to know what can he take over the counter that will not interfer with his other medicine,Due for Colonoscopy-wants to know if he can go off his medicines?

## 2013-01-28 NOTE — Telephone Encounter (Signed)
Returned call.  Pt informed message received and it is safe to take otc tylenol as directed on label.  Pt informed he is taking Plavix and ASA and should avoid other otc pain relievers.  Pt advised to see PCP for back pain if no relief w/ tylenol.  Pt verbalized understanding and agreed w/ plan.  Pt also informed Dr. Allyson Sabal is out of the office this week and will be notified of request regarding holding meds (Plavix/ASA) for colonoscopy, unless he needs a response sooner and an Extender will be notified.  Pt declined.  Stated he wants Dr. Allyson Sabal to advise and will wait for him to return.    Message forwarded to Dr. Allyson Sabal for review.  Paper chart# [id number] on Dr. Hazle Coca cart.

## 2013-02-06 NOTE — Telephone Encounter (Signed)
10 hold his aspirin and Plavix for his colonoscopy

## 2013-02-09 NOTE — Telephone Encounter (Signed)
Pt notified. Ok ASA and plavix to hold 5-7 days prior

## 2013-03-18 ENCOUNTER — Other Ambulatory Visit: Payer: Self-pay | Admitting: Cardiovascular Disease

## 2013-03-19 ENCOUNTER — Ambulatory Visit (INDEPENDENT_AMBULATORY_CARE_PROVIDER_SITE_OTHER): Payer: BC Managed Care – PPO | Admitting: Cardiovascular Disease

## 2013-03-19 ENCOUNTER — Encounter: Payer: Self-pay | Admitting: Cardiovascular Disease

## 2013-03-19 VITALS — BP 102/68 | HR 59 | Ht 72.0 in | Wt 187.1 lb

## 2013-03-19 DIAGNOSIS — I2510 Atherosclerotic heart disease of native coronary artery without angina pectoris: Secondary | ICD-10-CM

## 2013-03-19 DIAGNOSIS — I2109 ST elevation (STEMI) myocardial infarction involving other coronary artery of anterior wall: Secondary | ICD-10-CM

## 2013-03-19 DIAGNOSIS — E785 Hyperlipidemia, unspecified: Secondary | ICD-10-CM

## 2013-03-19 NOTE — Progress Notes (Signed)
03/19/2013 DAWID DUPRIEST   1950-09-25  [id number]  Primary Physician Lupe Carney, MD Primary Cardiologist: Runell Gess MD Roseanne Reno   HPI:  The patient is a 62 year old fit-appearing married Caucasian male, father of 2, who works as a Investment banker, corporate. I last saw him 6 months ago. He is status post acute inferior-wall myocardial infarction treated with PCI and stenting of his distal dominant RCA with a Promus Element drug-eluting stent (2.5 x 16 mm long). He did have 90% stenosis in the proximal acute marginal branch of the RCA with an EF of 50% and mild inferoapical hypokinesia. His peak CPK was 926 with an MB of 48 and a troponin of 15. He has had no recurrent symptoms. Echo performed August 23, 2011, was entirely normal and a Myoview showed apical and septal scar without ischemia. His most recent lab work revealed a total cholesterol of 128, LDL of 78, and HDL of 33. Since I saw him a year ago he's been totally asymptomatic.     Current Outpatient Prescriptions  Medication Sig Dispense Refill  . aspirin 81 MG tablet Take 81 mg by mouth daily.      Marland Kitchen atorvastatin (LIPITOR) 40 MG tablet Take 40 mg by mouth daily.      . clopidogrel (PLAVIX) 75 MG tablet TAKE 1 TABLET BY MOUTH EVERY DAY  30 tablet  2  . metoprolol tartrate (LOPRESSOR) 12.5 mg TABS Take 0.5 tablets (12.5 mg total) by mouth 2 (two) times daily.  60 tablet  12  . NITROSTAT 0.4 MG SL tablet PLACE 1 TABLET UNDER THE TONGUE EVERY 5 MINUTES AS NEEDED FOR CHEST PAIN  25 tablet  1   No current facility-administered medications for this visit.    No Known Allergies  History   Social History  . Marital Status: Married    Spouse Name: N/A    Number of Children: N/A  . Years of Education: N/A   Occupational History  . Not on file.   Social History Main Topics  . Smoking status: Never Smoker   . Smokeless tobacco: Never Used  . Alcohol Use: No  . Drug Use: Not on file  . Sexual Activity: Not  on file   Other Topics Concern  . Not on file   Social History Narrative  . No narrative on file     Review of Systems: General: negative for chills, fever, night sweats or weight changes.  Cardiovascular: negative for chest pain, dyspnea on exertion, edema, orthopnea, palpitations, paroxysmal nocturnal dyspnea or shortness of breath Dermatological: negative for rash Respiratory: negative for cough or wheezing Urologic: negative for hematuria Abdominal: negative for nausea, vomiting, diarrhea, bright red blood per rectum, melena, or hematemesis Neurologic: negative for visual changes, syncope, or dizziness All other systems reviewed and are otherwise negative except as noted above.    Blood pressure 102/68, pulse 59, height 6' (1.829 m), weight 187 lb 1.6 oz (84.868 kg).  General appearance: alert and no distress Neck: no adenopathy, no carotid bruit, no JVD, supple, symmetrical, trachea midline and thyroid not enlarged, symmetric, no tenderness/mass/nodules Lungs: clear to auscultation bilaterally Heart: regular rate and rhythm, S1, S2 normal, no murmur, click, rub or gallop Extremities: extremities normal, atraumatic, no cyanosis or edema  EKG sinus bradycardia of 59 without ST or T wave changes.  ASSESSMENT AND PLAN:   Hyperlipidemia On statin therapy. His last lipid profile performed 04/03/12 the total cholesterol of 128, LDL of 78 and HDL of 33. We  will recheck a fasting lipid and liver profile  Acute anterior wall MI Status post acute inferior wall myocardial infarction , PCI and stenting of the dominant RCA 06/10/2011 using a drug-eluting stent. Her circumflex and LAD had mild disease. His ejection fraction was 50% with mild inferoapical hypokinesia.his last Myoview performed 08/23/11 revealed apical and septal scar without ischemia he is totally asymptomatic.      Runell Gess MD FACP,FACC,FAHA, [facility] 03/19/2013 9:52 AM

## 2013-03-19 NOTE — Patient Instructions (Signed)
Dylan Johnson would like for you to have a fasting blood work.  Dr Allyson Sabal to see you in one year.

## 2013-03-19 NOTE — Assessment & Plan Note (Signed)
On statin therapy. His last lipid profile performed 04/03/12 the total cholesterol of 128, LDL of 78 and HDL of 33. We will recheck a fasting lipid and liver profile

## 2013-03-19 NOTE — Telephone Encounter (Signed)
Rx was sent to pharmacy electronically. 

## 2013-03-19 NOTE — Assessment & Plan Note (Signed)
Status post acute inferior wall myocardial infarction , PCI and stenting of the dominant RCA 06/10/2011 using a drug-eluting stent. Her circumflex and LAD had mild disease. His ejection fraction was 50% with mild inferoapical hypokinesia.his last Myoview performed 08/23/11 revealed apical and septal scar without ischemia he is totally asymptomatic.

## 2013-03-20 LAB — HEPATIC FUNCTION PANEL
AST: 23 U/L (ref 0–37)
Bilirubin, Direct: 0.1 mg/dL (ref 0.0–0.3)
Indirect Bilirubin: 0.4 mg/dL (ref 0.0–0.9)
Total Bilirubin: 0.5 mg/dL (ref 0.3–1.2)

## 2013-03-20 LAB — LIPID PANEL
Cholesterol: 104 mg/dL (ref 0–200)
Total CHOL/HDL Ratio: 3.4 Ratio
VLDL: 14 mg/dL (ref 0–40)

## 2013-03-30 ENCOUNTER — Encounter: Payer: Self-pay | Admitting: *Deleted

## 2013-04-01 ENCOUNTER — Other Ambulatory Visit: Payer: Self-pay | Admitting: Cardiovascular Disease

## 2013-04-01 MED ORDER — ATORVASTATIN CALCIUM 80 MG PO TABS: 40.0000 mg | ORAL_TABLET | Freq: Every day | ORAL | Status: DC

## 2013-04-01 NOTE — Telephone Encounter (Addendum)
Rx was sent to pharmacy electronically and dosaging instructions clarified.

## 2013-04-01 NOTE — Addendum Note (Signed)
Addended by: Neta Ehlers on: 04/01/2013 12:58 PM   Modules accepted: Orders, Medications

## 2013-05-08 ENCOUNTER — Other Ambulatory Visit: Payer: Self-pay | Admitting: Cardiovascular Disease

## 2013-05-11 NOTE — Telephone Encounter (Signed)
Rx was sent to pharmacy electronically. 

## 2013-05-25 ENCOUNTER — Telehealth: Payer: Self-pay | Admitting: Cardiovascular Disease

## 2013-05-25 NOTE — Telephone Encounter (Signed)
Had to go to the emergency room at [facility] in [place] on Saturday,05-23-12,while he was out sightseeing. He had a very uncomfortable feeling in his chest and dull pain on right chest side oh his chest. His.BP at the time was 180/105, BP was up for about 6 hrs and than it started decreasing.They did the blood test that indicated that he did not have a heart attack. His question is now what should he do,he still have the dull aching in right side of his chest? It might be muscular,he works out in [employer].He does have a copy of his lab work that they did.Dylan Johnson

## 2013-05-25 NOTE — Telephone Encounter (Signed)
Returned call and pt verified x 2.  Pt informed message received and advised he needs an ER f/u appt for evaluation.  Pt wanted to know Dr. Kennon Holter opinion and informed Dr. Kennon Holter appointments are booked this week.  Advised he come in to see an Extender this week when Dr. Gwenlyn Found is in the office.  Pt agreed w/ plan and will come in to sign a ROI for records from ER visit in [place].  Appt scheduled for 1.16.15 at 8am w/ Kerin Ransom, PA-C.

## 2013-05-29 ENCOUNTER — Encounter: Payer: Self-pay | Admitting: Cardiology

## 2013-05-29 ENCOUNTER — Ambulatory Visit (INDEPENDENT_AMBULATORY_CARE_PROVIDER_SITE_OTHER): Payer: BC Managed Care – PPO | Admitting: Cardiology

## 2013-05-29 VITALS — BP 130/80 | HR 60 | Ht 72.0 in | Wt 192.0 lb

## 2013-05-29 DIAGNOSIS — E785 Hyperlipidemia, unspecified: Secondary | ICD-10-CM

## 2013-05-29 DIAGNOSIS — I2510 Atherosclerotic heart disease of native coronary artery without angina pectoris: Secondary | ICD-10-CM

## 2013-05-29 DIAGNOSIS — Z8249 Family history of ischemic heart disease and other diseases of the circulatory system: Secondary | ICD-10-CM

## 2013-05-29 DIAGNOSIS — R079 Chest pain, unspecified: Secondary | ICD-10-CM

## 2013-05-29 NOTE — Patient Instructions (Signed)
Your physician recommends that you schedule a follow-up appointment in: Nov 2015

## 2013-05-29 NOTE — Assessment & Plan Note (Signed)
Low risk Myoview April 2013, Nl LVF

## 2013-05-29 NOTE — Assessment & Plan Note (Signed)
Pt hospitalized in [facility] ER 05/23/13- MI r/o

## 2013-05-29 NOTE — Assessment & Plan Note (Signed)
LDL 59, Nov 2014

## 2013-05-29 NOTE — Progress Notes (Signed)
05/29/2013 Dylan Johnson   11-26-1950  [id number]  Primary Sheliah Plane, MD Primary Cardiologist: Dr Gwenlyn Found  HPI:  63 y/o who presented in Jan 2013 with a DMI. He received an RCA DES. He had residual disease in the CFX-40 to 50%, and LAD 30-40%. Myoview in April 2013 was low risk. He has done well since his MI. Lipids done in Nov 2014 looked good. A week ago he was in [place] and noted Rt upper quadrant chest pain. It was described as an "ache" with some "pressure". He took NTG without relif. He became concerned and called EMS. NTG by EMS had no effect. He was taken to [facility] ER. He ruled out for an MI and was discharged. He is here today for follow up. He says he has continued to have a constant Rt chest "ache". It is not exertional and not getting worse. He is not sure if it's similar to his MI pain, he thinks it may be similar pain but in a different location. He has been exercising regularly- "5K 3 times a week plus weights" though he has not exercised since he developed this pain.    Current Outpatient Prescriptions  Medication Sig Dispense Refill  . aspirin 81 MG tablet Take 81 mg by mouth daily.      Marland Kitchen atorvastatin (LIPITOR) 80 MG tablet Take 0.5 tablets (40 mg total) by mouth daily.  15 tablet  10  . clopidogrel (PLAVIX) 75 MG tablet TAKE 1 TABLET BY MOUTH EVERY DAY  30 tablet  6  . metoprolol tartrate (LOPRESSOR) 12.5 mg TABS Take 0.5 tablets (12.5 mg total) by mouth 2 (two) times daily.  60 tablet  12  . NITROSTAT 0.4 MG SL tablet PLACE 1 TABLET UNDER THE TONGUE EVERY 5 MINUTES AS NEEDED FOR CHEST PAIN  25 tablet  1   No current facility-administered medications for this visit.    No Known Allergies  History   Social History  . Marital Status: Married    Spouse Name: N/A    Number of Children: N/A  . Years of Education: N/A   Occupational History  . Not on file.   Social History Main Topics  . Smoking status: Never Smoker   . Smokeless tobacco: Never Used  .  Alcohol Use: No  . Drug Use: Not on file  . Sexual Activity: Not on file   Other Topics Concern  . Not on file   Social History Narrative  . No narrative on file     Review of Systems: General: negative for chills, fever, night sweats or weight changes.  Cardiovascular: negative for chest pain, dyspnea on exertion, edema, orthopnea, palpitations, paroxysmal nocturnal dyspnea or shortness of breath Dermatological: negative for rash Respiratory: negative for cough or wheezing Urologic: negative for hematuria Abdominal: negative for nausea, vomiting, diarrhea, bright red blood per rectum, melena, or hematemesis Neurologic: negative for visual changes, syncope, or dizziness All other systems reviewed and are otherwise negative except as noted above.    Blood pressure 130/80, pulse 60, height 6' (1.829 m), weight 192 lb (87.091 kg).  General appearance: alert, cooperative and no distress Neck: no carotid bruit and no JVD Lungs: clear to auscultation bilaterally Heart: regular rate and rhythm  EKG NSR  ASSESSMENT AND PLAN:   Chest pain Pt hospitalized in [facility] ER 05/23/13- MI r/o  CAD- DMI RCA DES 06/10/11- residual 40-50% CFX, 30-40% LAD Low risk Myoview April 2013, Nl LVF  Hyperlipidemia LDL 59, Nov 2014  Family history  of coronary artery disease .   PLAN  I suggested we proceed with an exercise Myoview but the pt felt this was not needed. He feels his pain is non cardiac. I told him to contact us if his symptoms change, otherwise he'll see Dr Gwenlyn Found in Nov 2015 for his annual check up.   Dylan Johnson KPA-C 05/29/2013 8:51 AM

## 2013-06-13 ENCOUNTER — Encounter: Payer: Self-pay | Admitting: Internal Medicine

## 2013-06-13 ENCOUNTER — Encounter: Payer: Self-pay | Admitting: *Deleted

## 2013-07-01 ENCOUNTER — Other Ambulatory Visit: Payer: Self-pay | Admitting: Cardiovascular Disease

## 2013-07-02 NOTE — Telephone Encounter (Signed)
Rx was sent to pharmacy electronically. 

## 2013-09-03 NOTE — Telephone Encounter (Signed)
Closed encounters °

## 2013-10-19 ENCOUNTER — Other Ambulatory Visit: Payer: Self-pay | Admitting: Cardiovascular Disease

## 2013-10-19 NOTE — Telephone Encounter (Signed)
Rx was sent to pharmacy electronically. 

## 2014-03-04 ENCOUNTER — Other Ambulatory Visit: Payer: Self-pay | Admitting: Cardiovascular Disease

## 2014-03-04 NOTE — Telephone Encounter (Signed)
Rx was sent to pharmacy electronically. 

## 2014-03-31 ENCOUNTER — Other Ambulatory Visit: Payer: Self-pay | Admitting: Cardiovascular Disease

## 2014-03-31 NOTE — Telephone Encounter (Signed)
Rx was sent to pharmacy electronically. 

## 2014-04-19 ENCOUNTER — Telehealth: Payer: Self-pay | Admitting: Cardiovascular Disease

## 2014-04-20 NOTE — Telephone Encounter (Signed)
Closed encounter °

## 2014-04-22 ENCOUNTER — Encounter (HOSPITAL_COMMUNITY): Payer: Self-pay | Admitting: Cardiovascular Disease

## 2014-04-26 ENCOUNTER — Other Ambulatory Visit: Payer: Self-pay | Admitting: Cardiovascular Disease

## 2014-04-26 NOTE — Telephone Encounter (Signed)
Rx was sent to pharmacy electronically. OV 06/15/2014

## 2014-05-22 ENCOUNTER — Other Ambulatory Visit: Payer: Self-pay | Admitting: Cardiovascular Disease

## 2014-05-24 NOTE — Telephone Encounter (Signed)
Rx refill sent to patient pharmacy   

## 2014-06-02 ENCOUNTER — Encounter: Payer: Self-pay | Admitting: *Deleted

## 2014-06-15 ENCOUNTER — Ambulatory Visit (INDEPENDENT_AMBULATORY_CARE_PROVIDER_SITE_OTHER): Payer: BLUE CROSS/BLUE SHIELD | Admitting: Cardiovascular Disease

## 2014-06-15 ENCOUNTER — Encounter: Payer: Self-pay | Admitting: Cardiovascular Disease

## 2014-06-15 VITALS — BP 110/78 | HR 46 | Ht 71.0 in | Wt 189.6 lb

## 2014-06-15 DIAGNOSIS — E785 Hyperlipidemia, unspecified: Secondary | ICD-10-CM

## 2014-06-15 DIAGNOSIS — I2583 Coronary atherosclerosis due to lipid rich plaque: Secondary | ICD-10-CM

## 2014-06-15 DIAGNOSIS — I2510 Atherosclerotic heart disease of native coronary artery without angina pectoris: Secondary | ICD-10-CM

## 2014-06-15 DIAGNOSIS — Z79899 Other long term (current) drug therapy: Secondary | ICD-10-CM

## 2014-06-15 DIAGNOSIS — I25810 Atherosclerosis of coronary artery bypass graft(s) without angina pectoris: Secondary | ICD-10-CM

## 2014-06-15 LAB — HEPATIC FUNCTION PANEL
ALBUMIN: 4 g/dL (ref 3.5–5.2)
ALT: 23 U/L (ref 0–53)
AST: 21 U/L (ref 0–37)
Alkaline Phosphatase: 52 U/L (ref 39–117)
BILIRUBIN DIRECT: 0.1 mg/dL (ref 0.0–0.3)
BILIRUBIN TOTAL: 0.5 mg/dL (ref 0.2–1.2)
Indirect Bilirubin: 0.4 mg/dL (ref 0.2–1.2)
TOTAL PROTEIN: 7 g/dL (ref 6.0–8.3)

## 2014-06-15 LAB — LIPID PANEL
CHOL/HDL RATIO: 3.9 ratio
Cholesterol: 108 mg/dL (ref 0–200)
HDL: 28 mg/dL — AB (ref >39)
LDL CALC: 63 mg/dL (ref 0–99)
TRIGLYCERIDES: 86 mg/dL (ref <150)
VLDL: 17 mg/dL (ref 0–40)

## 2014-06-15 MED ORDER — NITROGLYCERIN 0.4 MG SL SUBL: 0.4000 mg | SUBLINGUAL_TABLET | SUBLINGUAL | Status: DC | PRN

## 2014-06-15 NOTE — Assessment & Plan Note (Signed)
History of CAD status post inferior wall myocardial infarction 06/11/11 treated with stenting of his distal dominant RCA with a Promus element drug-eluting stent (2.5 mm x 16 mm long). He did have a 90% stenosis in the proximal acute marginal branch of the RCA with an EF of 50% and mild infero-apical hypokinesia. His peak CPK was 926 with an MB of 48 and troponin of 15. 2-D echocardiogram performed 08/23/11 was entirely normal and a Myoview stress test showed apical and septal scar without ischemia. He denies chest pain or shortness of breath.

## 2014-06-15 NOTE — Progress Notes (Signed)
06/15/2014 Dylan Johnson   1951-02-01  [id number]  Primary Physician Donnie Coffin, MD Primary Cardiologist: Lorretta Harp MD Renae Gloss   HPI:  The patient is a 64 year old fit-appearing married Caucasian male, father of 2, who works as a Secondary school teacher. I last saw him 14 months ago. He is status post acute inferior-wall myocardial infarction treated with PCI and stenting of his distal dominant RCA with a Promus Element drug-eluting stent (2.5 x 16 mm long). He did have 90% stenosis in the proximal acute marginal branch of the RCA with an EF of 50% and mild inferoapical hypokinesia. His peak CPK was 926 with an MB of 48 and a troponin of 15. He has had no recurrent symptoms. Echo performed August 23, 2011, was entirely normal and a Myoview showed apical and septal scar without ischemia.since I saw him one year ago he has remained completely a symptomatically.  Current Outpatient Prescriptions  Medication Sig Dispense Refill  . aspirin 81 MG tablet Take 81 mg by mouth daily.    Marland Kitchen atorvastatin (LIPITOR) 80 MG tablet Take 0.5 tablets (40 mg total) by mouth daily. 15 tablet 1  . clopidogrel (PLAVIX) 75 MG tablet TAKE 1 TABLET BY MOUTH EVERY DAY 30 tablet 1  . metoprolol tartrate (LOPRESSOR) 25 MG tablet TAKE 1/2 TABLET BY MOUTH TWICE DAILY 30 tablet 0  . nitroGLYCERIN (NITROSTAT) 0.4 MG SL tablet Place 1 tablet (0.4 mg total) under the tongue every 5 (five) minutes as needed for chest pain. 25 tablet 3   No current facility-administered medications for this visit.    No Known Allergies  History   Social History  . Marital Status: Married    Spouse Name: N/A    Number of Children: N/A  . Years of Education: N/A   Occupational History  . Not on file.   Social History Main Topics  . Smoking status: Never Smoker   . Smokeless tobacco: Never Used  . Alcohol Use: No  . Drug Use: Not on file  . Sexual Activity: Not on file   Other Topics Concern  . Not on file    Social History Narrative     Review of Systems: General: negative for chills, fever, night sweats or weight changes.  Cardiovascular: negative for chest pain, dyspnea on exertion, edema, orthopnea, palpitations, paroxysmal nocturnal dyspnea or shortness of breath Dermatological: negative for rash Respiratory: negative for cough or wheezing Urologic: negative for hematuria Abdominal: negative for nausea, vomiting, diarrhea, bright red blood per rectum, melena, or hematemesis Neurologic: negative for visual changes, syncope, or dizziness All other systems reviewed and are otherwise negative except as noted above.    Blood pressure 110/78, pulse 46, height 5\' 11"  (1.803 m), weight 189 lb 9.6 oz (86.002 kg).  General appearance: alert and no distress Neck: no adenopathy, no carotid bruit, no JVD, supple, symmetrical, trachea midline and thyroid not enlarged, symmetric, no tenderness/mass/nodules Lungs: clear to auscultation bilaterally Heart: regular rate and rhythm, S1, S2 normal, no murmur, click, rub or gallop Extremities: extremities normal, atraumatic, no cyanosis or edema  EKG sinus bradycardia at 46 with early repolarization pattern and evidence of left ventricular hypertrophy. I personally reviewed this EKG  ASSESSMENT AND PLAN:   Hyperlipidemia Dylan Johnson  has a history of hyperlipidemia on atorvastatin 80 mg a day. We will recheck a lipid and liver profile   CAD- DMI RCA DES 06/10/11- residual 40-50% CFX, 30-40% LAD History of CAD status post inferior wall myocardial infarction 06/11/11  treated with stenting of his distal dominant RCA with a Promus element drug-eluting stent (2.5 mm x 16 mm long). He did have a 90% stenosis in the proximal acute marginal branch of the RCA with an EF of 50% and mild infero-apical hypokinesia. His peak CPK was 926 with an MB of 48 and troponin of 15. 2-D echocardiogram performed 08/23/11 was entirely normal and a Myoview stress test showed  apical and septal scar without ischemia. He denies chest pain or shortness of breath.       Lorretta Harp MD FACP,FACC,FAHA, [facility] 06/15/2014 8:09 AM

## 2014-06-15 NOTE — Assessment & Plan Note (Signed)
Mr. Wiemers  has a history of hyperlipidemia on atorvastatin 80 mg a day. We will recheck a lipid and liver profile

## 2014-06-15 NOTE — Patient Instructions (Signed)
Dr. Gwenlyn Found has ordered for you to have lab work done today.  Your physician wants you to follow-up in 1 year with Dr. Gwenlyn Found. You will receive a reminder letter in the mail 2 months in advance. If you do not receive a letter, please call our office to schedule the follow-up appointment.

## 2014-06-20 ENCOUNTER — Other Ambulatory Visit: Payer: Self-pay | Admitting: Cardiovascular Disease

## 2014-06-21 NOTE — Telephone Encounter (Signed)
Rx(s) sent to pharmacy electronically.  

## 2014-06-22 ENCOUNTER — Other Ambulatory Visit: Payer: Self-pay | Admitting: *Deleted

## 2014-06-22 ENCOUNTER — Other Ambulatory Visit: Payer: Self-pay | Admitting: Cardiovascular Disease

## 2014-06-22 MED ORDER — ATORVASTATIN CALCIUM 80 MG PO TABS: 40.0000 mg | ORAL_TABLET | Freq: Every day | ORAL | Status: DC

## 2014-06-22 MED ORDER — METOPROLOL TARTRATE 25 MG PO TABS: 12.5000 mg | ORAL_TABLET | Freq: Two times a day (BID) | ORAL | Status: DC

## 2014-06-22 NOTE — Telephone Encounter (Signed)
°  1. Which medications need to be refilled? Metoprolol 20mg   2. Which pharmacy is medication to be sent to?[facility]  3. Do they need a 30 day or 90 day supply? 30  4. Would they like a call back once the medication has been sent to the pharmacy? no

## 2014-06-22 NOTE — Telephone Encounter (Signed)
Rx(s) sent to pharmacy electronically.  

## 2014-06-22 NOTE — Telephone Encounter (Signed)
Refills submitted for faxed requests from pharmacy.

## 2015-05-23 ENCOUNTER — Observation Stay (HOSPITAL_BASED_OUTPATIENT_CLINIC_OR_DEPARTMENT_OTHER): Payer: BLUE CROSS/BLUE SHIELD

## 2015-05-23 ENCOUNTER — Encounter (HOSPITAL_COMMUNITY): Payer: Self-pay | Admitting: *Deleted

## 2015-05-23 ENCOUNTER — Observation Stay (HOSPITAL_COMMUNITY)
Admission: EM | Admit: 2015-05-23 | Discharge: 2015-05-23 | Disposition: A | Discharge status: Home / Self Care | Payer: BLUE CROSS/BLUE SHIELD | Attending: Internal Medicine | Admitting: Internal Medicine

## 2015-05-23 ENCOUNTER — Encounter (HOSPITAL_COMMUNITY)
Admission: EM | Disposition: A | Discharge status: Home / Self Care | Payer: BLUE CROSS/BLUE SHIELD | Source: Home / Self Care | Attending: Emergency Medicine

## 2015-05-23 ENCOUNTER — Emergency Department (HOSPITAL_COMMUNITY): Payer: BLUE CROSS/BLUE SHIELD

## 2015-05-23 DIAGNOSIS — I25110 Atherosclerotic heart disease of native coronary artery with unstable angina pectoris: Secondary | ICD-10-CM | POA: Diagnosis not present

## 2015-05-23 DIAGNOSIS — I2582 Chronic total occlusion of coronary artery: Secondary | ICD-10-CM | POA: Diagnosis not present

## 2015-05-23 DIAGNOSIS — M1990 Unspecified osteoarthritis, unspecified site: Secondary | ICD-10-CM | POA: Insufficient documentation

## 2015-05-23 DIAGNOSIS — R079 Chest pain, unspecified: Secondary | ICD-10-CM | POA: Diagnosis not present

## 2015-05-23 DIAGNOSIS — E785 Hyperlipidemia, unspecified: Secondary | ICD-10-CM | POA: Diagnosis not present

## 2015-05-23 DIAGNOSIS — Z7902 Long term (current) use of antithrombotics/antiplatelets: Secondary | ICD-10-CM | POA: Insufficient documentation

## 2015-05-23 DIAGNOSIS — I252 Old myocardial infarction: Secondary | ICD-10-CM | POA: Diagnosis not present

## 2015-05-23 DIAGNOSIS — Z955 Presence of coronary angioplasty implant and graft: Secondary | ICD-10-CM | POA: Insufficient documentation

## 2015-05-23 DIAGNOSIS — Z7982 Long term (current) use of aspirin: Secondary | ICD-10-CM | POA: Diagnosis not present

## 2015-05-23 DIAGNOSIS — I209 Angina pectoris, unspecified: Secondary | ICD-10-CM | POA: Diagnosis not present

## 2015-05-23 DIAGNOSIS — I2510 Atherosclerotic heart disease of native coronary artery without angina pectoris: Secondary | ICD-10-CM

## 2015-05-23 HISTORY — PX: CARDIAC CATHETERIZATION: SHX172

## 2015-05-23 LAB — BASIC METABOLIC PANEL
ANION GAP: 8 (ref 5–15)
BUN: 16 mg/dL (ref 6–20)
CALCIUM: 9.2 mg/dL (ref 8.9–10.3)
CHLORIDE: 106 mmol/L (ref 101–111)
CO2: 26 mmol/L (ref 22–32)
Creatinine, Ser: 1.18 mg/dL (ref 0.61–1.24)
GFR calc non Af Amer: >60 mL/min (ref >60)
Glucose, Bld: 101 mg/dL — ABNORMAL HIGH (ref 65–99)
POTASSIUM: 3.9 mmol/L (ref 3.5–5.1)
Sodium: 140 mmol/L (ref 135–145)

## 2015-05-23 LAB — CBC
HCT: 46.5 % (ref 39.0–52.0)
HEMATOCRIT: 43.8 % (ref 39.0–52.0)
HEMOGLOBIN: 14.5 g/dL (ref 13.0–17.0)
HEMOGLOBIN: 15.4 g/dL (ref 13.0–17.0)
MCH: 28.9 pg (ref 26.0–34.0)
MCH: 29 pg (ref 26.0–34.0)
MCHC: 33.1 g/dL (ref 30.0–36.0)
MCHC: 33.1 g/dL (ref 30.0–36.0)
MCV: 87.4 fL (ref 78.0–100.0)
MCV: 87.6 fL (ref 78.0–100.0)
Platelets: 194 10*3/uL (ref 150–400)
Platelets: 207 10*3/uL (ref 150–400)
RBC: 5 MIL/uL (ref 4.22–5.81)
RBC: 5.32 MIL/uL (ref 4.22–5.81)
RDW: 13.3 % (ref 11.5–15.5)
RDW: 13.5 % (ref 11.5–15.5)
WBC: 7.7 10*3/uL (ref 4.0–10.5)
WBC: 8 10*3/uL (ref 4.0–10.5)

## 2015-05-23 LAB — TROPONIN I: Troponin I: <0.03 ng/mL (ref <0.031)

## 2015-05-23 LAB — CREATININE, SERUM: Creatinine, Ser: 1.12 mg/dL (ref 0.61–1.24)

## 2015-05-23 LAB — PROTIME-INR
INR: 1.15 (ref 0.00–1.49)
Prothrombin Time: 14.8 seconds (ref 11.6–15.2)

## 2015-05-23 LAB — D-DIMER, QUANTITATIVE (NOT AT ARMC): D DIMER QUANT: 0.4 ug{FEU}/mL (ref 0.00–0.50)

## 2015-05-23 SURGERY — LEFT HEART CATH AND CORONARY ANGIOGRAPHY

## 2015-05-23 MED ORDER — IOHEXOL 350 MG/ML SOLN
INTRAVENOUS | Status: DC | PRN
  Administered 2015-05-23: 100 mL via INTRA_ARTERIAL

## 2015-05-23 MED ORDER — ACETAMINOPHEN 325 MG PO TABS: 650.0000 mg | ORAL_TABLET | ORAL | Status: DC | PRN

## 2015-05-23 MED ORDER — MORPHINE SULFATE (PF) 2 MG/ML IV SOLN: 2.0000 mg | INTRAVENOUS | Status: DC | PRN

## 2015-05-23 MED ORDER — NITROGLYCERIN 1 MG/10 ML FOR IR/CATH LAB
INTRA_ARTERIAL | Status: AC
  Filled 2015-05-23: qty 10

## 2015-05-23 MED ORDER — HEPARIN (PORCINE) IN NACL 2-0.9 UNIT/ML-% IJ SOLN
INTRAMUSCULAR | Status: DC | PRN
  Administered 2015-05-23: 12:00:00 via INTRA_ARTERIAL

## 2015-05-23 MED ORDER — LIDOCAINE HCL (PF) 1 % IJ SOLN
INTRAMUSCULAR | Status: AC
  Filled 2015-05-23: qty 30

## 2015-05-23 MED ORDER — LIDOCAINE HCL (PF) 1 % IJ SOLN
INTRAMUSCULAR | Status: DC | PRN
  Administered 2015-05-23: 12:00:00

## 2015-05-23 MED ORDER — NITROGLYCERIN 0.4 MG SL SUBL
0.4000 mg | SUBLINGUAL_TABLET | SUBLINGUAL | Status: DC | PRN
  Administered 2015-05-23: 0.4 mg via SUBLINGUAL
  Filled 2015-05-23: qty 1

## 2015-05-23 MED ORDER — HEPARIN SODIUM (PORCINE) 1000 UNIT/ML IJ SOLN
INTRAMUSCULAR | Status: DC | PRN
  Administered 2015-05-23: 4500 [IU] via INTRAVENOUS

## 2015-05-23 MED ORDER — FENTANYL CITRATE (PF) 100 MCG/2ML IJ SOLN
INTRAMUSCULAR | Status: AC
  Filled 2015-05-23: qty 2

## 2015-05-23 MED ORDER — MIDAZOLAM HCL 2 MG/2ML IJ SOLN
INTRAMUSCULAR | Status: DC | PRN
  Administered 2015-05-23: 2 mg via INTRAVENOUS

## 2015-05-23 MED ORDER — ASPIRIN EC 81 MG PO TBEC
81.0000 mg | DELAYED_RELEASE_TABLET | Freq: Every day | ORAL | Status: DC
  Administered 2015-05-23: 81 mg via ORAL
  Filled 2015-05-23: qty 1

## 2015-05-23 MED ORDER — HEPARIN (PORCINE) IN NACL 2-0.9 UNIT/ML-% IJ SOLN
INTRAMUSCULAR | Status: AC
  Filled 2015-05-23: qty 1000

## 2015-05-23 MED ORDER — FENTANYL CITRATE (PF) 100 MCG/2ML IJ SOLN
INTRAMUSCULAR | Status: DC | PRN
  Administered 2015-05-23: 50 ug via INTRAVENOUS

## 2015-05-23 MED ORDER — ASPIRIN 81 MG PO CHEW: 81.0000 mg | CHEWABLE_TABLET | ORAL | Status: DC

## 2015-05-23 MED ORDER — ENOXAPARIN SODIUM 40 MG/0.4ML ~~LOC~~ SOLN: 40.0000 mg | Freq: Every day | SUBCUTANEOUS | Status: DC

## 2015-05-23 MED ORDER — CLOPIDOGREL BISULFATE 75 MG PO TABS
75.0000 mg | ORAL_TABLET | Freq: Once | ORAL | Status: AC
  Administered 2015-05-23: 75 mg via ORAL
  Filled 2015-05-23: qty 1

## 2015-05-23 MED ORDER — SODIUM CHLORIDE 0.9 % IJ SOLN: 3.0000 mL | Freq: Two times a day (BID) | INTRAMUSCULAR | Status: DC

## 2015-05-23 MED ORDER — HEPARIN SODIUM (PORCINE) 1000 UNIT/ML IJ SOLN
INTRAMUSCULAR | Status: AC
  Filled 2015-05-23: qty 1

## 2015-05-23 MED ORDER — CLOPIDOGREL BISULFATE 75 MG PO TABS
75.0000 mg | ORAL_TABLET | Freq: Every day | ORAL | Status: DC
  Administered 2015-05-23: 75 mg via ORAL
  Filled 2015-05-23: qty 1

## 2015-05-23 MED ORDER — SODIUM CHLORIDE 0.9 % IV SOLN: INTRAVENOUS | Status: DC

## 2015-05-23 MED ORDER — SODIUM CHLORIDE 0.9 % IJ SOLN: 3.0000 mL | INTRAMUSCULAR | Status: DC | PRN

## 2015-05-23 MED ORDER — ATORVASTATIN CALCIUM 40 MG PO TABS
40.0000 mg | ORAL_TABLET | Freq: Every day | ORAL | Status: DC
  Filled 2015-05-23: qty 1

## 2015-05-23 MED ORDER — MIDAZOLAM HCL 2 MG/2ML IJ SOLN
INTRAMUSCULAR | Status: AC
Start: 2015-05-23 — End: 2015-05-23
  Filled 2015-05-23: qty 2

## 2015-05-23 MED ORDER — SODIUM CHLORIDE 0.9 % IV SOLN: 250.0000 mL | INTRAVENOUS | Status: DC | PRN

## 2015-05-23 MED ORDER — ONDANSETRON HCL 4 MG/2ML IJ SOLN: 4.0000 mg | Freq: Four times a day (QID) | INTRAMUSCULAR | Status: DC | PRN

## 2015-05-23 MED ORDER — SODIUM CHLORIDE 0.9 % IV SOLN
INTRAVENOUS | Status: DC | PRN
  Administered 2015-05-23: 8136 mL via INTRAVENOUS

## 2015-05-23 MED ORDER — NITROGLYCERIN 0.4 MG SL SUBL: 0.4000 mg | SUBLINGUAL_TABLET | SUBLINGUAL | Status: DC | PRN

## 2015-05-23 MED ORDER — VERAPAMIL HCL 2.5 MG/ML IV SOLN
INTRAVENOUS | Status: AC
  Filled 2015-05-23: qty 2

## 2015-05-23 MED ORDER — METOPROLOL TARTRATE 12.5 MG HALF TABLET
12.5000 mg | ORAL_TABLET | Freq: Two times a day (BID) | ORAL | Status: DC
  Administered 2015-05-23: 12.5 mg via ORAL
  Filled 2015-05-23: qty 1

## 2015-05-23 SURGICAL SUPPLY — 11 items
CATH INFINITI 5FR ANG PIGTAIL (CATHETERS) ×2 IMPLANT
CATH INFINITI 5FR JL4 (CATHETERS) ×2 IMPLANT
CATH INFINITI JR4 5F (CATHETERS) ×2 IMPLANT
DEVICE RAD COMP TR BAND LRG (VASCULAR PRODUCTS) ×2 IMPLANT
GLIDESHEATH SLEND SS 6F .021 (SHEATH) ×2 IMPLANT
KIT HEART LEFT (KITS) ×2 IMPLANT
PACK CARDIAC CATHETERIZATION (CUSTOM PROCEDURE TRAY) ×2 IMPLANT
SYR MEDRAD MARK V 150ML (SYRINGE) ×2 IMPLANT
TRANSDUCER W/STOPCOCK (MISCELLANEOUS) ×2 IMPLANT
TUBING CIL FLEX 10 FLL-RA (TUBING) ×2 IMPLANT
WIRE SAFE-T 1.5MM-J .035X260CM (WIRE) ×2 IMPLANT

## 2015-05-23 NOTE — Interval H&P Note (Signed)
History and Physical Interval Note:  05/23/2015 11:31 AM  Dylan Johnson  has presented today for cardiac cath with the diagnosis of unstable angina, CAD. The various methods of treatment have been discussed with the patient and family. After consideration of risks, benefits and other options for treatment, the patient has consented to  Procedure(s): Left Heart Cath and Coronary Angiography (N/A) as a surgical intervention .  The patient's history has been reviewed, patient examined, no change in status, stable for surgery.  I have reviewed the patient's chart and labs.  Questions were answered to the patient's satisfaction.   Cath Lab Visit (complete for each Cath Lab visit)  Clinical Evaluation Leading to the Procedure:   ACS: No.  Non-ACS:    Anginal Classification: CCS III  Anti-ischemic medical therapy: Minimal Therapy (1 class of medications)  Non-Invasive Test Results: No non-invasive testing performed  Prior CABG: No previous CABG          MCALHANY,CHRISTOPHER

## 2015-05-23 NOTE — Consult Note (Signed)
CARDIOLOGY CONSULT NOTE   Patient ID: Dylan Johnson MRN: [id number] DOB/AGE: August 16, 1950 65 y.o.  Admit date: 05/23/2015  Primary Physician   Dylan Coffin, MD Primary Cardiologist   Dr. Gwenlyn Johnson Reason for Consultation   Chest pain Requesting Physician Dr. Tana Johnson  HPI: Dylan Johnson is a 65 y.o. male with a history of CAD s/p DES to CAD and Hl who came to [facility] ED for evaluation of ongoing chest pain.   He is status post acute inferior-wall myocardial infarction 06/10/2011 treated with PCI and stenting of his distal dominant RCA with a Promus Element drug-eluting stent (2.5 x 16 mm long). He did have 90% stenosis in the proximal acute marginal branch of the RCA with an EF of 50% and mild inferoapical hypokinesia.  He also had residual 40-50% CFX, 30-40% LAD. Echo performed August 23, 2011, was entirely normal and a Myoview showed apical and septal scar without ischemia.   He has an ongoing chest pain since his last cath. He described the pain as a pressure/achy substernal area. The pain lasts ranging from a few minutes to hours to days. He hasn't tried any nitroglycerin because he is afraid of taking as patient had a syncope episode after taking second nitroglycerin during his MI. The pain recently has been getting worse which occurs with activity as well as at rest. Since Christmas he has been having constant substernal chest pressure at rate of 1-2/10. Around midnight he woke up with chest pressure, 4 out of 10 and came to ED for further evaluation. No associated symptoms or radiation of pain. His pain improved after sublingual nitroglycerin x 1. The patient denies nausea, vomiting, fever, palpitations, shortness of breath, orthopnea, PND, dizziness, syncope, cough, congestion, abdominal pain, hematochezia, melena, lower extremity edema.   EKG shows normal sinus rhythm with nonspecific ST abnormality in lateral lead and lead 3. Troponin x 2 negative. Chest x-ray clear. Currently chest  pain-free  Past Medical History  Diagnosis Date  . Arthritis   . Coronary artery disease 06/10/11    DMI RCA DES  . Hyperlipidemia   . Observation for suspected cardiovascular disease      Past Surgical History  Procedure Laterality Date  . Hernia repair    . Angioplasty  06/10/11    DMI-RCA DES  . Lasik  2002  . Left heart catheterization with coronary angiogram N/A 06/10/2011    Procedure: LEFT HEART CATHETERIZATION WITH CORONARY ANGIOGRAM;  Surgeon: Dylan Harp, MD;  Location: [facility] CATH LAB;  Service: Cardiovascular;  Laterality: N/A;  . Percutaneous coronary stent intervention (pci-s)  06/10/2011    Procedure: PERCUTANEOUS CORONARY STENT INTERVENTION (PCI-S);  Surgeon: Dylan Harp, MD;  Location: [facility] CATH LAB;  Service: Cardiovascular;;  . Transthoracic echocardiogram  08/23/2011    EF = >55% prominent eustachian valve, normal echo  . Nm myocar perf wall motion  08/23/2011    protocol:Bruce, moderate ischemia in Piccal Spetal/Apical region. post EF59% low risk scan    No Known Allergies  I have reviewed the patient's current medications . aspirin EC  81 mg Oral Daily  . atorvastatin  40 mg Oral Daily  . clopidogrel  75 mg Oral Daily  . enoxaparin (LOVENOX) injection  40 mg Subcutaneous Daily  . metoprolol tartrate  12.5 mg Oral BID     acetaminophen, morphine injection, nitroGLYCERIN, ondansetron (ZOFRAN) IV  Prior to Admission medications   Medication Sig Start Date End Date Taking? Authorizing Provider  aspirin 81 MG tablet Take 81  mg by mouth daily.   Yes Historical Provider, MD  atorvastatin (LIPITOR) 80 MG tablet Take 0.5 tablets (40 mg total) by mouth daily. 06/22/14  Yes Dylan Harp, MD  clopidogrel (PLAVIX) 75 MG tablet TAKE 1 TABLET BY MOUTH EVERY DAY 06/21/14  Yes Dylan Harp, MD  metoprolol tartrate (LOPRESSOR) 25 MG tablet Take 0.5 tablets (12.5 mg total) by mouth 2 (two) times daily. 06/22/14  Yes Dylan Harp, MD  nitroGLYCERIN (NITROSTAT) 0.4  MG SL tablet Place 1 tablet (0.4 mg total) under the tongue every 5 (five) minutes as needed for chest pain. 06/15/14  Yes Dylan Harp, MD     Social History   Social History  . Marital Status: Married    Spouse Name: N/A  . Number of Children: N/A  . Years of Education: N/A   Occupational History  . Not on file.   Social History Main Topics  . Smoking status: Never Smoker   . Smokeless tobacco: Never Used  . Alcohol Use: No  . Drug Use: No  . Sexual Activity: Yes   Other Topics Concern  . Not on file   Social History Narrative    Family Status  Relation Status Death Age  . Mother Deceased   . Father Deceased   . Brother Deceased    Family History  Problem Relation Age of Onset  . Coronary artery disease    . Colon cancer Brother   . Heart attack Father   . Heart attack Mother   . Brain cancer Son       ROS:  Full 14 point review of systems complete and Johnson to be negative unless listed above.  Physical Exam: Blood pressure 142/75, pulse 54, temperature 98 F (36.7 C), temperature source Oral, resp. rate 18, height 6' (1.829 m), weight 199 lb 6.4 oz (90.447 kg), SpO2 99 %.  General: Well developed, well nourished, male in no acute distress Head: Eyes PERRLA, No xanthomas. Normocephalic and atraumatic, oropharynx without edema or exudate.  Lungs: Resp regular and unlabored, CTA. Heart: RRR no s3, s4, or murmurs..   Neck: No carotid bruits. No lymphadenopathy. No JVD. Abdomen: Bowel sounds present, abdomen soft and non-tender without masses or hernias noted. Msk:  No spine or cva tenderness. No weakness, no joint deformities or effusions. Extremities: No clubbing, cyanosis or edema. DP/PT/Radials 2+ and equal bilaterally. Neuro: Alert and oriented X 3. No focal deficits noted. Psych:  Good affect, responds appropriately Skin: No rashes or lesions noted.  Labs:   Lab Results  Component Value Date   WBC 7.7 05/23/2015   HGB 14.5 05/23/2015   HCT 43.8  05/23/2015   MCV 87.6 05/23/2015   PLT 194 05/23/2015   No results for input(s): INR in the last 72 hours.  Recent Labs Lab 05/23/15 0040 05/23/15 0656  NA 140  --   K 3.9  --   CL 106  --   CO2 26  --   BUN 16  --   CREATININE 1.18 1.12  CALCIUM 9.2  --   GLUCOSE 101*  --    No results Johnson for: MG  Recent Labs  05/23/15 0040 05/23/15 0656  TROPONINI <0.03 <0.03    Lab Results  Component Value Date   CHOL 108 06/15/2014   HDL 28* 06/15/2014   LDLCALC 63 06/15/2014   TRIG 86 06/15/2014    Echo: Reviewed 4/11/201   ECG:   Vent. rate 66 BPM PR interval 201 ms  QRS duration 96 ms QT/QTc 409/428 ms P-R-T axes 42 -3 4  Cath 06/10/11 ANGIOGRAPHIC RESULTS:   1. Left main; normal  2. LAD; minor irregularities to the apical LAD and the patient prior to the apex and was small 3. Left circumflex; 40% segmental stenosis in the mid AV groove just beyond a moderate-sized first marginal branch.  4. Right coronary artery; is a dominant vessel and with the culprit vessel. There were tandem 80 and 90% stenoses in the distal RCA at the crux just prior to the takeoff of a small PDA. There was a 40-50% stenosis at the genu. With what appears to be a RV branch that had a 95% ostial stenosis. 5. Left ventriculography; RAO left ventriculogram was performed using  25 mL of Visipaque dye at 12 mL/second. The overall LVEF estimated  50 % With wall motion abnormalities significant for mild inferoapical hypokinesia.  IMPRESSION:Dylan Johnson. Has unstable angina with suggestive EKG changes in the anterior leads though his him angiogram revealed high-grade distal dominant RCA stenosis. We'll proceed with PCI and stenting of his distal right coronary artery using a drug-eluting stent, Angiomax and effient.  Final impression: Successful PCI and stenting of the distal dominant right coronary artery using a Promus element drug-eluting stent. The patient has preserved LV function with wall  motion abnormality in the corresponding to the right coronary artery. We'll treat his acute marginal branch stenosis medically. He'll be treated with aspirin, effient. , beta blocker, it and statin drugs. He'll be fast tracked and probably be discharged home in the next 48 hours. The sheath was then securely in place. The patient left the lab in stable condition.  Radiology:  Dg Chest 2 View  05/23/2015  CLINICAL DATA:  Mid chest pain for 4 days, history of myocardial infarction and coronary artery stent. EXAM: CHEST  2 VIEW COMPARISON:  Chest radiograph June 10, 2011 FINDINGS: Cardiomediastinal silhouette is normal. Mildly tortuous aorta, mild link calcified aortic arch. The lungs are clear without pleural effusions or focal consolidations. Trachea projects midline and there is no pneumothorax. Soft tissue planes and included osseous structures are non-suspicious. IMPRESSION: No acute cardiopulmonary process. Electronically Signed   By: Elon Alas M.D.   On: 05/23/2015 01:23    ASSESSMENT AND PLAN:     1. Chest pain - Equivalent unstable angina. Ongoing which has been worsened.Rsolved with nitroglycerin x 1. Troponin  X 2 negative. EKG without acute abnormality. Likely needs cath. Pending D-dimer. The patient is NPO.  - Echo performed August 23, 2011, was entirely normal . Pending repeat echo.   2. CAD- DMI RCA DES 06/10/11- residual 40-50% CFX, 30-40% LAD - Continue ASA, plavix, BB and statin    3. Hyperlipidemia - 06/15/2014: Cholesterol 108; HDL 28*; LDL Cholesterol 63; Triglycerides 86; VLDL 17 - Continue statin. Will get lipid panel.    SignedLeanor Kail, PA 05/23/2015, 9:13 AM  Co-Sign MD Agree with note by Robbie Lis PA-C  Pt well known to me s/p RCA stenting 2013 with minimal LAD/LCX disease in setting of ACS. Admitted with 2 weeks of CP, somewhat atypical, but with features similar to previous event. Enz neg. EKG w/o acute changes. Labs OK. Suspect best strategy is  cath to define anatomy. Good radial pulse. Pt is agreeable.   Dylan Johnson, M.D., [place], [facility], [place] [address] [place], [place]  [address]  [phone] 05/23/2015 9:50 AM

## 2015-05-23 NOTE — ED Notes (Signed)
The pt is c/o mid-chest tightness for 72 hours.  No n v no sob no dizziness.  Mi feb   Alert no distress

## 2015-05-23 NOTE — Progress Notes (Signed)
TR band removed at 3:45p. A 2x2 guaze with tegaderm was applied. Site remains level 0. R pulses remain 2+, extremity is warm to touch, normal color for ethnicity. Vital signs stable. Pt instructed to not put any pressure or use arm. Pt instructed to call if arm begins to bleed. Pt verbalized understanding.    Pt has orders to be discharged. Discharge instructions given and pt has no additional questions at this time. Medication regimen reviewed and pt educated. Pt verbalized understanding and has no additional questions. Telemetry box removed. IV removed and site in good condition. Pt stable and waiting for transportation.

## 2015-05-23 NOTE — Progress Notes (Signed)
Echocardiogram 2D Echocardiogram has been performed.  Dylan Johnson 05/23/2015, 10:51 AM

## 2015-05-23 NOTE — Progress Notes (Signed)
Results of cath noted.No change in coronary anatomy since prior PCI. Etiology of symptoms clear but not ischemic. Can be D/Cd later today.He already has an appointment to see me 2/21.  Lorretta Harp, M.D., [place], [facility], [place] [address] [place], [place]  [address]  [phone] 05/23/2015 1:40 PM

## 2015-05-23 NOTE — H&P (View-Only) (Signed)
CARDIOLOGY CONSULT NOTE   Patient ID: Dylan Johnson MRN: [id number] DOB/AGE: 11-06-1950 39 y.o.  Admit date: 05/23/2015  Primary Physician   Donnie Coffin, MD Primary Cardiologist   Dr. Gwenlyn Found Reason for Consultation   Chest pain Requesting Physician Dr. Tana Coast  HPI: Dylan Johnson is a 65 y.o. male with a history of CAD s/p DES to CAD and Hl who came to [facility] ED for evaluation of ongoing chest pain.   He is status post acute inferior-wall myocardial infarction 06/10/2011 treated with PCI and stenting of his distal dominant RCA with a Promus Element drug-eluting stent (2.5 x 16 mm long). He did have 90% stenosis in the proximal acute marginal branch of the RCA with an EF of 50% and mild inferoapical hypokinesia.  He also had residual 40-50% CFX, 30-40% LAD. Echo performed August 23, 2011, was entirely normal and a Myoview showed apical and septal scar without ischemia.   He has an ongoing chest pain since his last cath. He described the pain as a pressure/achy substernal area. The pain lasts ranging from a few minutes to hours to days. He hasn't tried any nitroglycerin because he is afraid of taking as patient had a syncope episode after taking second nitroglycerin during his MI. The pain recently has been getting worse which occurs with activity as well as at rest. Since Christmas he has been having constant substernal chest pressure at rate of 1-2/10. Around midnight he woke up with chest pressure, 4 out of 10 and came to ED for further evaluation. No associated symptoms or radiation of pain. His pain improved after sublingual nitroglycerin x 1. The patient denies nausea, vomiting, fever, palpitations, shortness of breath, orthopnea, PND, dizziness, syncope, cough, congestion, abdominal pain, hematochezia, melena, lower extremity edema.   EKG shows normal sinus rhythm with nonspecific ST abnormality in lateral lead and lead 3. Troponin x 2 negative. Chest x-ray clear. Currently chest  pain-free  Past Medical History  Diagnosis Date  . Arthritis   . Coronary artery disease 06/10/11    DMI RCA DES  . Hyperlipidemia   . Observation for suspected cardiovascular disease      Past Surgical History  Procedure Laterality Date  . Hernia repair    . Angioplasty  06/10/11    DMI-RCA DES  . Lasik  2002  . Left heart catheterization with coronary angiogram N/A 06/10/2011    Procedure: LEFT HEART CATHETERIZATION WITH CORONARY ANGIOGRAM;  Surgeon: Lorretta Harp, MD;  Location: [facility] CATH LAB;  Service: Cardiovascular;  Laterality: N/A;  . Percutaneous coronary stent intervention (pci-s)  06/10/2011    Procedure: PERCUTANEOUS CORONARY STENT INTERVENTION (PCI-S);  Surgeon: Lorretta Harp, MD;  Location: [facility] CATH LAB;  Service: Cardiovascular;;  . Transthoracic echocardiogram  08/23/2011    EF = >55% prominent eustachian valve, normal echo  . Nm myocar perf wall motion  08/23/2011    protocol:Bruce, moderate ischemia in Piccal Spetal/Apical region. post EF59% low risk scan    No Known Allergies  I have reviewed the patient's current medications . aspirin EC  81 mg Oral Daily  . atorvastatin  40 mg Oral Daily  . clopidogrel  75 mg Oral Daily  . enoxaparin (LOVENOX) injection  40 mg Subcutaneous Daily  . metoprolol tartrate  12.5 mg Oral BID     acetaminophen, morphine injection, nitroGLYCERIN, ondansetron (ZOFRAN) IV  Prior to Admission medications   Medication Sig Start Date End Date Taking? Authorizing Provider  aspirin 81 MG tablet Take 81  mg by mouth daily.   Yes Historical Provider, MD  atorvastatin (LIPITOR) 80 MG tablet Take 0.5 tablets (40 mg total) by mouth daily. 06/22/14  Yes Lorretta Harp, MD  clopidogrel (PLAVIX) 75 MG tablet TAKE 1 TABLET BY MOUTH EVERY DAY 06/21/14  Yes Lorretta Harp, MD  metoprolol tartrate (LOPRESSOR) 25 MG tablet Take 0.5 tablets (12.5 mg total) by mouth 2 (two) times daily. 06/22/14  Yes Lorretta Harp, MD  nitroGLYCERIN (NITROSTAT) 0.4  MG SL tablet Place 1 tablet (0.4 mg total) under the tongue every 5 (five) minutes as needed for chest pain. 06/15/14  Yes Lorretta Harp, MD     Social History   Social History  . Marital Status: Married    Spouse Name: N/A  . Number of Children: N/A  . Years of Education: N/A   Occupational History  . Not on file.   Social History Main Topics  . Smoking status: Never Smoker   . Smokeless tobacco: Never Used  . Alcohol Use: No  . Drug Use: No  . Sexual Activity: Yes   Other Topics Concern  . Not on file   Social History Narrative    Family Status  Relation Status Death Age  . Mother Deceased   . Father Deceased   . Brother Deceased    Family History  Problem Relation Age of Onset  . Coronary artery disease    . Colon cancer Brother   . Heart attack Father   . Heart attack Mother   . Brain cancer Son       ROS:  Full 14 point review of systems complete and found to be negative unless listed above.  Physical Exam: Blood pressure 142/75, pulse 54, temperature 98 F (36.7 C), temperature source Oral, resp. rate 18, height 6' (1.829 m), weight 199 lb 6.4 oz (90.447 kg), SpO2 99 %.  General: Well developed, well nourished, male in no acute distress Head: Eyes PERRLA, No xanthomas. Normocephalic and atraumatic, oropharynx without edema or exudate.  Lungs: Resp regular and unlabored, CTA. Heart: RRR no s3, s4, or murmurs..   Neck: No carotid bruits. No lymphadenopathy. No JVD. Abdomen: Bowel sounds present, abdomen soft and non-tender without masses or hernias noted. Msk:  No spine or cva tenderness. No weakness, no joint deformities or effusions. Extremities: No clubbing, cyanosis or edema. DP/PT/Radials 2+ and equal bilaterally. Neuro: Alert and oriented X 3. No focal deficits noted. Psych:  Good affect, responds appropriately Skin: No rashes or lesions noted.  Labs:   Lab Results  Component Value Date   WBC 7.7 05/23/2015   HGB 14.5 05/23/2015   HCT 43.8  05/23/2015   MCV 87.6 05/23/2015   PLT 194 05/23/2015   No results for input(s): INR in the last 72 hours.  Recent Labs Lab 05/23/15 0040 05/23/15 0656  NA 140  --   K 3.9  --   CL 106  --   CO2 26  --   BUN 16  --   CREATININE 1.18 1.12  CALCIUM 9.2  --   GLUCOSE 101*  --    No results found for: MG  Recent Labs  05/23/15 0040 05/23/15 0656  TROPONINI <0.03 <0.03    Lab Results  Component Value Date   CHOL 108 06/15/2014   HDL 28* 06/15/2014   LDLCALC 63 06/15/2014   TRIG 86 06/15/2014    Echo: Reviewed 4/11/201   ECG:   Vent. rate 66 BPM PR interval 201 ms  QRS duration 96 ms QT/QTc 409/428 ms P-R-T axes 42 -3 4  Cath 06/10/11 ANGIOGRAPHIC RESULTS:   1. Left main; normal  2. LAD; minor irregularities to the apical LAD and the patient prior to the apex and was small 3. Left circumflex; 40% segmental stenosis in the mid AV groove just beyond a moderate-sized first marginal branch.  4. Right coronary artery; is a dominant vessel and with the culprit vessel. There were tandem 80 and 90% stenoses in the distal RCA at the crux just prior to the takeoff of a small PDA. There was a 40-50% stenosis at the genu. With what appears to be a RV branch that had a 95% ostial stenosis. 5. Left ventriculography; RAO left ventriculogram was performed using  25 mL of Visipaque dye at 12 mL/second. The overall LVEF estimated  50 % With wall motion abnormalities significant for mild inferoapical hypokinesia.  IMPRESSION:Dylan Johnson. Has unstable angina with suggestive EKG changes in the anterior leads though his him angiogram revealed high-grade distal dominant RCA stenosis. We'll proceed with PCI and stenting of his distal right coronary artery using a drug-eluting stent, Angiomax and effient.  Final impression: Successful PCI and stenting of the distal dominant right coronary artery using a Promus element drug-eluting stent. The patient has preserved LV function with wall  motion abnormality in the corresponding to the right coronary artery. We'll treat his acute marginal branch stenosis medically. He'll be treated with aspirin, effient. , beta blocker, it and statin drugs. He'll be fast tracked and probably be discharged home in the next 48 hours. The sheath was then securely in place. The patient left the lab in stable condition.  Radiology:  Dg Chest 2 View  05/23/2015  CLINICAL DATA:  Mid chest pain for 4 days, history of myocardial infarction and coronary artery stent. EXAM: CHEST  2 VIEW COMPARISON:  Chest radiograph June 10, 2011 FINDINGS: Cardiomediastinal silhouette is normal. Mildly tortuous aorta, mild link calcified aortic arch. The lungs are clear without pleural effusions or focal consolidations. Trachea projects midline and there is no pneumothorax. Soft tissue planes and included osseous structures are non-suspicious. IMPRESSION: No acute cardiopulmonary process. Electronically Signed   By: Elon Alas M.D.   On: 05/23/2015 01:23    ASSESSMENT AND PLAN:     1. Chest pain - Equivalent unstable angina. Ongoing which has been worsened.Rsolved with nitroglycerin x 1. Troponin  X 2 negative. EKG without acute abnormality. Likely needs cath. Pending D-dimer. The patient is NPO.  - Echo performed August 23, 2011, was entirely normal . Pending repeat echo.   2. CAD- DMI RCA DES 06/10/11- residual 40-50% CFX, 30-40% LAD - Continue ASA, plavix, BB and statin    3. Hyperlipidemia - 06/15/2014: Cholesterol 108; HDL 28*; LDL Cholesterol 63; Triglycerides 86; VLDL 17 - Continue statin. Will get lipid panel.    SignedLeanor Kail, PA 05/23/2015, 9:13 AM  Co-Sign MD Agree with note by Robbie Lis PA-C  Pt well known to me s/p RCA stenting 2013 with minimal LAD/LCX disease in setting of ACS. Admitted with 2 weeks of CP, somewhat atypical, but with features similar to previous event. Enz neg. EKG w/o acute changes. Labs OK. Suspect best strategy is  cath to define anatomy. Good radial pulse. Pt is agreeable.   Lorretta Harp, M.D., [place], [facility], [place] [address] [place], [place]  [address]  [phone] 05/23/2015 9:50 AM

## 2015-05-23 NOTE — ED Notes (Signed)
Ntg given X 1.  Patient reports feeling whoozy and warm all over.  States the tightness is some better.  Now rating at 3/10.  Family remains at the bedside.

## 2015-05-23 NOTE — H&P (Signed)
[facility] History and Physical  Dylan Johnson [id number] DOB: November 12, 1950 DOA: 05/23/2015  Referring physician: Dr.Nanavati. PCP: Donnie Coffin, MD  Specialists: Dr.Berry. Cardiologist.  Chief Complaint: Chest pain.  HPI: Dylan Johnson is a 65 y.o. male with history of CAD status post stenting in 2013 presents to the ER because of chest pain. Patient has been having off-and-on chest pain for last few weeks but last 2 weeks since Christmas patient's chest pain has become more persistent. Patient chest pain retrosternal present even at rest. Denies any associated shortness of breath productive cough diaphoresis nausea vomiting or abdominal pain. In the ER EKG shows normal sinus rhythm with nonspecific changes with chest x-ray showing nothing acute. Cardiac markers were negative. Patient's chest pain improved with sublingual nitroglycerin. Patient has been admitted for possible angina. Patient states 2 years ago he had similar pain and was admitted at [facility] and was ruled out for ACS.  Review of Systems: As presented in the history of presenting illness, rest negative.  Past Medical History  Diagnosis Date  . Arthritis   . Coronary artery disease 06/10/11    DMI RCA DES  . Hyperlipidemia   . Observation for suspected cardiovascular disease    Past Surgical History  Procedure Laterality Date  . Hernia repair    . Angioplasty  06/10/11    DMI-RCA DES  . Lasik  2002  . Left heart catheterization with coronary angiogram N/A 06/10/2011    Procedure: LEFT HEART CATHETERIZATION WITH CORONARY ANGIOGRAM;  Surgeon: Lorretta Harp, MD;  Location: [facility] CATH LAB;  Service: Cardiovascular;  Laterality: N/A;  . Percutaneous coronary stent intervention (pci-s)  06/10/2011    Procedure: PERCUTANEOUS CORONARY STENT INTERVENTION (PCI-S);  Surgeon: Lorretta Harp, MD;  Location: [facility] CATH LAB;  Service: Cardiovascular;;  . Transthoracic echocardiogram  08/23/2011    EF = >55% prominent  eustachian valve, normal echo  . Nm myocar perf wall motion  08/23/2011    protocol:Bruce, moderate ischemia in Piccal Spetal/Apical region. post EF59% low risk scan   Social History:  reports that he has never smoked. He has never used smokeless tobacco. He reports that he does not drink alcohol or use illicit drugs. Where does patient live home. Can patient participate in ADLs? Yes.  No Known Allergies  Family History:  Family History  Problem Relation Age of Onset  . Coronary artery disease    . Colon cancer Brother   . Heart attack Father   . Heart attack Mother   . Brain cancer Son       Prior to Admission medications   Medication Sig Start Date End Date Taking? Authorizing Provider  aspirin 81 MG tablet Take 81 mg by mouth daily.   Yes Historical Provider, MD  atorvastatin (LIPITOR) 80 MG tablet Take 0.5 tablets (40 mg total) by mouth daily. 06/22/14  Yes Lorretta Harp, MD  clopidogrel (PLAVIX) 75 MG tablet TAKE 1 TABLET BY MOUTH EVERY DAY 06/21/14  Yes Lorretta Harp, MD  metoprolol tartrate (LOPRESSOR) 25 MG tablet Take 0.5 tablets (12.5 mg total) by mouth 2 (two) times daily. 06/22/14  Yes Lorretta Harp, MD  nitroGLYCERIN (NITROSTAT) 0.4 MG SL tablet Place 1 tablet (0.4 mg total) under the tongue every 5 (five) minutes as needed for chest pain. 06/15/14  Yes Lorretta Harp, MD    Physical Exam: Filed Vitals:   05/23/15 0330 05/23/15 0400 05/23/15 0407 05/23/15 0434  BP: 120/76 119/70  142/75  Pulse: 53 54  54  Temp:   98.8 F (37.1 C) 98 F (36.7 C)  TempSrc:   Oral Oral  Resp: 16 16  18   Height:    6' (1.829 m)  Weight:    90.447 kg (199 lb 6.4 oz)  SpO2: 96% 98%  99%     General:  Moderately built and nourished.  Eyes: Anicteric no pallor.  ENT: No discharge from the ears eyes nose or mouth.  Neck: No mass felt. No JVD appreciated.  Cardiovascular: S1-S2 heard.  Respiratory: No rhonchi or crepitations.  Abdomen: Soft nontender bowel sounds  present.  Skin: No rash.  Musculoskeletal: No edema.  Psychiatric: Appears normal.  Neurologic: Alert awake oriented to time place and person. Moves all extremities.  Labs on Admission:  Basic Metabolic Panel:  Recent Labs Lab 05/23/15 0040  NA 140  K 3.9  CL 106  CO2 26  GLUCOSE 101*  BUN 16  CREATININE 1.18  CALCIUM 9.2   Liver Function Tests: No results for input(s): AST, ALT, ALKPHOS, BILITOT, PROT, ALBUMIN in the last 168 hours. No results for input(s): LIPASE, AMYLASE in the last 168 hours. No results for input(s): AMMONIA in the last 168 hours. CBC:  Recent Labs Lab 05/23/15 0040  WBC 8.0  HGB 15.4  HCT 46.5  MCV 87.4  PLT 207   Cardiac Enzymes:  Recent Labs Lab 05/23/15 0040  TROPONINI <0.03    BNP (last 3 results) No results for input(s): BNP in the last 8760 hours.  ProBNP (last 3 results) No results for input(s): PROBNP in the last 8760 hours.  CBG: No results for input(s): GLUCAP in the last 168 hours.  Radiological Exams on Admission: Dg Chest 2 View  05/23/2015  CLINICAL DATA:  Mid chest pain for 4 days, history of myocardial infarction and coronary artery stent. EXAM: CHEST  2 VIEW COMPARISON:  Chest radiograph June 10, 2011 FINDINGS: Cardiomediastinal silhouette is normal. Mildly tortuous aorta, mild link calcified aortic arch. The lungs are clear without pleural effusions or focal consolidations. Trachea projects midline and there is no pneumothorax. Soft tissue planes and included osseous structures are non-suspicious. IMPRESSION: No acute cardiopulmonary process. Electronically Signed   By: Elon Alas M.D.   On: 05/23/2015 01:23    EKG: Independently reviewed. Normal sinus rhythm with nonspecific T-wave changes.  Assessment/Plan Principal Problem:   Chest pain Active Problems:   CAD- DMI RCA DES 06/10/11- residual 40-50% CFX, 30-40% LAD   Hyperlipidemia   Angina pectoris ([place])   1. Chest pain with history of CAD  concerning for unstable angina - patient chest pain improved with sublingual nitroglycerin. At this time we will continue with Plavix aspirin or nitroglycerin statins and metoprolol. Patient will be kept nothing by mouth in anticipation of possible cardiac procedure. Check 2-D echo. 2. Hyperlipidemia on statins.   DVT Prophylaxis Lovenox.  Code Status: Full code.  Family Communication: Patient's wife at the bedside.  Disposition Plan: Admit for observation.    Pearline Yerby N. [facility] Pager [phone].  If 7PM-7AM, please contact night-coverage [email or web address] Password [email or web address] 05/23/2015, 5:59 AM

## 2015-05-23 NOTE — ED Provider Notes (Signed)
CSN: [id number]     Arrival date & time 05/23/15  0022 History  By signing my name below, I, Hansel Feinstein, attest that this documentation has been prepared under the direction and in the presence of Varney Biles, MD. Electronically Signed: Hansel Feinstein, ED Scribe. 05/23/2015. 1:31 AM.    Chief Complaint  Patient presents with  . Chest Pain   The history is provided by the patient. No language interpreter was used.    HPI Comments: HONG NIEWINSKI is a 65 y.o. male with h/o CAD with stent, MI in 2013, HLD who presents to the Emergency Department complaining of moderate, tight, dull, aching, non-exertional, intermittent substernal CP and pressure for several months and worsened for the past week to be constant. Pt states that his pain is unaffected by positional changes or food. He took Tums with no relief of pain. He reports that he has not taken NTG because it does not normally alleviate his symptoms. He notes that his current pain is similar to h/o MI in 2013, but not as severe, and describes this episode as a dull pain and pressure to the same location. He states his current pain is not as sudden onset or severe. Pt also reports an episode of the same pressure and dull pain in 2014 and had a negative work up with no catheterizations. Pt reports that he frequently has mild similar pains and they resolve on their own within a few days, but this episode has not resolved. He states he has not contacted his PCP or cardiologist for his current symptoms. Pt sees his cardiologist yearly and last had a stress test 3.5 years ago. He takes daily 81 mg ASA and 75 mg Plavix, with no missed doses. He denies additional symptoms.   Past Medical History  Diagnosis Date  . Arthritis   . Coronary artery disease 06/10/11    DMI RCA DES  . Hyperlipidemia   . Observation for suspected cardiovascular disease    Past Surgical History  Procedure Laterality Date  . Hernia repair    . Angioplasty  06/10/11    DMI-RCA  DES  . Lasik  2002  . Left heart catheterization with coronary angiogram N/A 06/10/2011    Procedure: LEFT HEART CATHETERIZATION WITH CORONARY ANGIOGRAM;  Surgeon: Lorretta Harp, MD;  Location: [facility] CATH LAB;  Service: Cardiovascular;  Laterality: N/A;  . Percutaneous coronary stent intervention (pci-s)  06/10/2011    Procedure: PERCUTANEOUS CORONARY STENT INTERVENTION (PCI-S);  Surgeon: Lorretta Harp, MD;  Location: [facility] CATH LAB;  Service: Cardiovascular;;  . Transthoracic echocardiogram  08/23/2011    EF = >55% prominent eustachian valve, normal echo  . Nm myocar perf wall motion  08/23/2011    protocol:Bruce, moderate ischemia in Piccal Spetal/Apical region. post EF59% low risk scan   Family History  Problem Relation Age of Onset  . Coronary artery disease    . Colon cancer Brother   . Heart attack Father   . Heart attack Mother   . Brain cancer Son    Social History  Substance Use Topics  . Smoking status: Never Smoker   . Smokeless tobacco: Never Used  . Alcohol Use: No    Review of Systems A complete 10 system review of systems was obtained and all systems are negative except as noted in the HPI and PMH.    Allergies  Review of patient's allergies indicates no known allergies.  Home Medications   Prior to Admission medications   Medication Sig  Start Date End Date Taking? Authorizing Provider  aspirin 81 MG tablet Take 81 mg by mouth daily.   Yes Historical Provider, MD  atorvastatin (LIPITOR) 80 MG tablet Take 0.5 tablets (40 mg total) by mouth daily. 06/22/14  Yes Lorretta Harp, MD  clopidogrel (PLAVIX) 75 MG tablet TAKE 1 TABLET BY MOUTH EVERY DAY 06/21/14  Yes Lorretta Harp, MD  metoprolol tartrate (LOPRESSOR) 25 MG tablet Take 0.5 tablets (12.5 mg total) by mouth 2 (two) times daily. 06/22/14  Yes Lorretta Harp, MD  nitroGLYCERIN (NITROSTAT) 0.4 MG SL tablet Place 1 tablet (0.4 mg total) under the tongue every 5 (five) minutes as needed for chest pain. 06/15/14  Yes  Lorretta Harp, MD   BP 113/86 mmHg  Pulse 59  Temp(Src) 98.3 F (36.8 C) (Oral)  Resp 16  SpO2 100% Physical Exam  Constitutional: He is oriented to person, place, and time. He appears well-developed and well-nourished.  HENT:  Head: Normocephalic and atraumatic.  Eyes: Conjunctivae and EOM are normal. Pupils are equal, round, and reactive to light.  Neck: Normal range of motion. Neck supple.  Cardiovascular: Normal rate, regular rhythm and normal heart sounds.  Exam reveals no gallop and no friction rub.   No murmur heard. Pulmonary/Chest: Effort normal and breath sounds normal. No respiratory distress. He has no wheezes. He has no rales.  Lungs CTA bilaterally.   Abdominal: He exhibits no distension. There is no tenderness.  Musculoskeletal: Normal range of motion.  Neurological: He is alert and oriented to person, place, and time.  Skin: Skin is warm and dry.  Psychiatric: He has a normal mood and affect. His behavior is normal.  Nursing note and vitals reviewed.   ED Course  Procedures (including critical care time) DIAGNOSTIC STUDIES: Oxygen Saturation is 97% on RA, normal by my interpretation.    COORDINATION OF CARE: 1:30 AM Discussed treatment plan with pt at bedside which includes lab work, EKG, CXR and pt agreed to plan.   2:14 AM Pt reevaluated, who reports no change in pain after the first dose of NTG. Discussed normal Trop level with pt. Advised pt they will be admitted overnight. Pt is agreeable.    2:53 AM Pt reports his CP has resolved with NTG and Plavix.    Labs Review Labs Reviewed  BASIC METABOLIC PANEL - Abnormal; Notable for the following:    Glucose, Bld 101 (*)    All other components within normal limits  CBC  TROPONIN I    Imaging Review Dg Chest 2 View  05/23/2015  CLINICAL DATA:  Mid chest pain for 4 days, history of myocardial infarction and coronary artery stent. EXAM: CHEST  2 VIEW COMPARISON:  Chest radiograph June 10, 2011  FINDINGS: Cardiomediastinal silhouette is normal. Mildly tortuous aorta, mild link calcified aortic arch. The lungs are clear without pleural effusions or focal consolidations. Trachea projects midline and there is no pneumothorax. Soft tissue planes and included osseous structures are non-suspicious. IMPRESSION: No acute cardiopulmonary process. Electronically Signed   By: Elon Alas M.D.   On: 05/23/2015 01:23   I have personally reviewed and evaluated these images and lab results as part of my medical decision-making.   EKG Interpretation   Date/Time:  Monday May 23 2015 00:29:36 EST Ventricular Rate:  66 PR Interval:  201 QRS Duration: 96 QT Interval:  409 QTC Calculation: 428 R Axis:   -3 Text Interpretation:  Sinus rhythm Low voltage, precordial leads Abnormal  R-wave progression, early  transition Probable left ventricular hypertrophy  Nonspecific ST and T wave abnormality No significant change since last  tracing Confirmed by Kathrynn Humble, MD, Siraj Dermody [phone]) on 05/23/2015 12:36:29 AM      MDM   Final diagnoses:  Angina pectoris ([place])    I personally performed the services described in this documentation, which was scribed in my presence. The recorded information has been reviewed and is accurate.  PT comes in with cc of chest pain. Chest pain is atypical - but he had similar pain when he had his heart attack. Pt given nitro - pain resolved. Will admit.    Varney Biles, MD 05/23/15 [phone]

## 2015-05-23 NOTE — Progress Notes (Signed)
   05/23/15 0434  Vitals  Temp 98 F (36.7 C)  Temp Source Oral  BP (!) 142/75 mmHg  BP Location Left Arm  BP Method Automatic  Patient Position (if appropriate) Sitting  Pulse Rate (!) 54  Resp 18  Oxygen Therapy  SpO2 99 %  O2 Device Room Air  Height and Weight  Height 6' (1.829 m)  Weight 90.447 kg (199 lb 6.4 oz)  Type of Scale Used Standing (scale A)  Type of Weight Actual  BSA (Calculated - sq m) 2.14 sq meters  BMI (Calculated) 27.1  Weight in (lb) to have BMI = 25 183.9  admitted pt to rm 3E08 from ED, pt alert and oriented, denied chest pain at this time, oriented to room, call bell placed within reach. Dr. Hal Hope notified.

## 2015-05-23 NOTE — Discharge Summary (Signed)
Physician Discharge Summary   Patient ID: CAYNAN BROSIUS MRN: [id number] DOB/AGE: Sep 17, 1950 65 y.o.  Admit date: 05/23/2015 Discharge date: 05/23/2015  Primary Care Physician:  Donnie Coffin, MD  Discharge Diagnoses:    . Angina pectoris ([place]) . arthritis  . Hyperlipidemia . CAD- DMI RCA DES 06/10/11- residual 40-50% CFX, 30-40% LAD  Consults:  Cardiology, Dr. Gwenlyn Found  Recommendations for Outpatient Follow-up:  1. Please repeat CBC/BMET at next visit   DIET: Heart healthy diet    Allergies:  No Known Allergies   DISCHARGE MEDICATIONS: Current Discharge Medication List    CONTINUE these medications which have NOT CHANGED   Details  aspirin 81 MG tablet Take 81 mg by mouth daily.    atorvastatin (LIPITOR) 80 MG tablet Take 0.5 tablets (40 mg total) by mouth daily. Qty: 45 tablet, Refills: 3    clopidogrel (PLAVIX) 75 MG tablet TAKE 1 TABLET BY MOUTH EVERY DAY Qty: 30 tablet, Refills: 11    metoprolol tartrate (LOPRESSOR) 25 MG tablet Take 0.5 tablets (12.5 mg total) by mouth 2 (two) times daily. Qty: 180 tablet, Refills: 3    nitroGLYCERIN (NITROSTAT) 0.4 MG SL tablet Place 1 tablet (0.4 mg total) under the tongue every 5 (five) minutes as needed for chest pain. Qty: 25 tablet, Refills: 3         Brief H and P: For complete details please refer to admission H and P, but in brief REINALD TORELLO is a 65 y.o. male with history of CAD status post stenting in 2013 presented to the ER because of chest pain. Patient has been having off-and-on chest pain for last few weeks but last 2 weeks since Christmas patient's chest pain has become more persistent. Patient reported chest pain retrosternal present even at rest. Denied any associated shortness of breath productive cough diaphoresis nausea vomiting or abdominal pain. In the ER EKG shows normal sinus rhythm with nonspecific changes with chest x-ray showing nothing acute. Cardiac markers were negative. Patient's chest  pain improved with sublingual nitroglycerin. Patient was admitted for possible angina. Patient stated 2 years ago he had similar pain and was admitted at [facility] and was ruled out for ACS.  Hospital Course:  Chest pain with history of CAD, DES cardiac stent in January 2013 - Currently resolved. Troponins 2 negative, chest x-ray was clear, EKG showed nonspecific ST abnormality in the lateral lead 3. D-dimer was negative. - Cardiology was consulted and patient was seen by Dr. Gwenlyn Found. He was recommended cardiac. Per cardiac cath results, no change in coronary anatomy since prior PCI and patient was cleared to be discharged home. Patient denied having GI symptoms.  2-D echo showed EF of 60-65%   Procedures    Left Heart Cath and Coronary Angiography    Conclusion    1. Double vessel CAD 2. The RCA is a large dominant vessel with patent distal stent. There is no significant restenosis in the stent. There is a very small caliber branch that is jailed by the stent on the posterior wall with diffuse 70% stenosis in this branch. The small caliber RV marginal branch has a 99% stenosis at the ostium, unchanged from last cath.  3. The Circumflex has mild non-obstructive disease.  4. The LAD had mild proximal stenosis and then gives off a moderate caliber diagonal branch and moderate caliber septal perforating branch. The LAD is occluded beyond the septal perforating branch and is seen to fill from left to left collaterals. The LAD was occluded on last  cath in 2013 but there was no collateral filling at that time.  5. Overall preserved LV systolic function  Recommendations: The LAD is chronically occluded and not a favorable target for PCI. Could consider referral to our CTO team for evaluation. Continue medical management of CAD for now.     Day of Discharge BP 114/63 mmHg  Pulse 53  Temp(Src) 98.5 F (36.9 C) (Oral)  Resp 16  Ht 6' (1.829 m)  Wt 90.447 kg (199 lb 6.4 oz)  BMI 27.04 kg/m2   SpO2 96%  Physical Exam: General: Alert and awake oriented x3 not in any acute distress. HEENT: anicteric sclera, pupils reactive to light and accommodation CVS: S1-S2 clear no murmur rubs or gallops Chest: clear to auscultation bilaterally, no wheezing rales or rhonchi Abdomen: soft nontender, nondistended, normal bowel sounds Extremities: no cyanosis, clubbing or edema noted bilaterally Neuro: Cranial nerves II-XII intact, no focal neurological deficits   The results of significant diagnostics from this hospitalization (including imaging, microbiology, ancillary and laboratory) are listed below for reference.    LAB RESULTS: Basic Metabolic Panel:  Recent Labs Lab 05/23/15 0040 05/23/15 0656  NA 140  --   K 3.9  --   CL 106  --   CO2 26  --   GLUCOSE 101*  --   BUN 16  --   CREATININE 1.18 1.12  CALCIUM 9.2  --    Liver Function Tests: No results for input(s): AST, ALT, ALKPHOS, BILITOT, PROT, ALBUMIN in the last 168 hours. No results for input(s): LIPASE, AMYLASE in the last 168 hours. No results for input(s): AMMONIA in the last 168 hours. CBC:  Recent Labs Lab 05/23/15 0040 05/23/15 0656  WBC 8.0 7.7  HGB 15.4 14.5  HCT 46.5 43.8  MCV 87.4 87.6  PLT 207 194   Cardiac Enzymes:  Recent Labs Lab 05/23/15 0040 05/23/15 0656  TROPONINI <0.03 <0.03   BNP: Invalid input(s): POCBNP CBG: No results for input(s): GLUCAP in the last 168 hours.  Significant Diagnostic Studies:  Dg Chest 2 View  05/23/2015  CLINICAL DATA:  Mid chest pain for 4 days, history of myocardial infarction and coronary artery stent. EXAM: CHEST  2 VIEW COMPARISON:  Chest radiograph June 10, 2011 FINDINGS: Cardiomediastinal silhouette is normal. Mildly tortuous aorta, mild link calcified aortic arch. The lungs are clear without pleural effusions or focal consolidations. Trachea projects midline and there is no pneumothorax. Soft tissue planes and included osseous structures are  non-suspicious. IMPRESSION: No acute cardiopulmonary process. Electronically Signed   By: Elon Alas M.D.   On: 05/23/2015 01:23    2D ECHO: Study Conclusions  - Left ventricle: The cavity size was normal. Wall thickness was normal. Systolic function was normal. The estimated ejection fraction was in the range of 60% to 65%.  Disposition and Follow-up: Discharge Instructions    Diet - low sodium heart healthy    Complete by:  As directed      Discharge instructions    Complete by:  As directed   Please follow with primary physician if you continue to have any further recurrent episodes of chest pain.     Increase activity slowly    Complete by:  As directed             DISPOSITION: Home   DISCHARGE FOLLOW-UP Follow-up Information    Follow up with Quay Burow, MD On 07/05/2015.   Specialties:  Cardiology, Radiology   Why:  @10 :00am for post hospital  Contact information:   [address] [place] [address] [phone]       Follow up with Donnie Coffin, MD. Schedule an appointment as soon as possible for a visit in 2 weeks.   Specialty:  Family Medicine   Why:  for hospital follow-up   Contact information:   [address] [place] [address] [phone]        Time spent on Discharge: 25 minute  Signed:   RAI,RIPUDEEP M.D. [facility] 05/23/2015, 2:04 PM Pager: [phone]

## 2015-05-23 NOTE — ED Notes (Signed)
Taken to xray at this time. 

## 2015-05-25 MED FILL — Nitroglycerin IV Soln 100 MCG/ML in D5W: INTRA_ARTERIAL | Qty: 10 | Status: AC

## 2015-06-21 ENCOUNTER — Telehealth: Payer: Self-pay | Admitting: Cardiovascular Disease

## 2015-06-21 NOTE — Telephone Encounter (Signed)
Pt wants to confirm that blood work is or isnt needed for next appt with Dr. Gwenlyn Found -pls advise [phone]

## 2015-06-21 NOTE — Telephone Encounter (Signed)
Told patient that there is not any lab work  that needs to be done before his visit with Dr. Gwenlyn Found on 2/21

## 2015-07-05 ENCOUNTER — Ambulatory Visit (INDEPENDENT_AMBULATORY_CARE_PROVIDER_SITE_OTHER): Payer: BLUE CROSS/BLUE SHIELD | Admitting: Cardiovascular Disease

## 2015-07-05 ENCOUNTER — Encounter: Payer: Self-pay | Admitting: Cardiovascular Disease

## 2015-07-05 VITALS — BP 126/86 | HR 68 | Ht 72.0 in | Wt 198.0 lb

## 2015-07-05 DIAGNOSIS — I2510 Atherosclerotic heart disease of native coronary artery without angina pectoris: Secondary | ICD-10-CM | POA: Diagnosis not present

## 2015-07-05 DIAGNOSIS — I2583 Coronary atherosclerosis due to lipid rich plaque: Secondary | ICD-10-CM

## 2015-07-05 DIAGNOSIS — E785 Hyperlipidemia, unspecified: Secondary | ICD-10-CM

## 2015-07-05 MED ORDER — NITROGLYCERIN 0.4 MG SL SUBL: 0.4000 mg | SUBLINGUAL_TABLET | SUBLINGUAL | Status: DC | PRN

## 2015-07-05 NOTE — Assessment & Plan Note (Signed)
History of hyperlipidemia on statin therapy. We will recheck a lipid and liver profile 

## 2015-07-05 NOTE — Assessment & Plan Note (Signed)
Mr. Dylan Johnson. Has a history of CAD status post acute inferior wall Monocryl infarction treated with PCI and stenting of his distal dominant RCA with a Promus drug-eluting stent (2.5 mm x 16 mm). He did have a 90% stenosis in the proximal acute marginal branch of the RCA and occluded mid LAD with an EF of 50% and mild inferoapical hypokinesia. Echo performed 08/23/11 was entirely normal and Myoview stress test showed apical and septal scar without ischemia. He was admitted with chest pain on 05/23/15 and underwent cardiac catheterization by Dr. Clydene Fake revealing unchanged anatomy with widely patent stent and normal LV function. His enzymes were negative. His d-dimer was negative. His echo was normal and he was discharged home the same day. He has had no recurrent symptoms.

## 2015-07-05 NOTE — Patient Instructions (Signed)
Medication Instructions:  Your physician recommends that you continue on your current medications as directed. Please refer to the Current Medication list given to you today.   Labwork: Your physician recommends that you return for lab work in: FASTING (lipid/liver) The lab can be found on the FIRST FLOOR of out building in [address]   Testing/Procedures: none  Follow-Up: Your physician wants you to follow-up in: 12 months with Dr. Berry. You will receive a reminder letter in the mail two months in advance. If you don't receive a letter, please call our office to schedule the follow-up appointment.   Any Other Special Instructions Will Be Listed Below (If Applicable).     If you need a refill on your cardiac medications before your next appointment, please call your pharmacy.   

## 2015-07-05 NOTE — Progress Notes (Signed)
07/05/2015 Dylan Johnson   08-21-1950  [id number]  Primary Physician Donnie Coffin, MD Primary Cardiologist: Lorretta Harp MD Renae Gloss   HPI:  The patient is a 65 year old fit-appearing married Caucasian male, father of 2, who works as a Secondary school teacher. I last saw him 06/15/14. He is status post acute inferior-wall myocardial infarction treated with PCI and stenting of his distal dominant RCA with a Promus Element drug-eluting stent (2.5 x 16 mm long). He did have 90% stenosis in the proximal acute marginal branch of the RCA with an EF of 50% and mild inferoapical hypokinesia. His peak CPK was 926 with an MB of 48 and a troponin of 15. He has had no recurrent symptoms. Echo performed August 23, 2011, was entirely normal and a Myoview showed apical and septal scar without ischemia. He was admitted to [facility] on 05/23/15 with chest pain. He ruled out for myocardial infarction. He underwent cardiac catheterization by Dr. Prince Rome revealing unchanged anatomy with a patent distal RCA stent, scattered disease otherwise with a total mid LAD which was old, left left collaterals and normal LV function. His echo was normal. His d-dimer was negative and he was discharged home the same day. He has had no recurrent symptoms.  Current Outpatient Prescriptions  Medication Sig Dispense Refill  . aspirin 81 MG tablet Take 81 mg by mouth daily.    Marland Kitchen atorvastatin (LIPITOR) 80 MG tablet Take 0.5 tablets (40 mg total) by mouth daily. 45 tablet 3  . clopidogrel (PLAVIX) 75 MG tablet TAKE 1 TABLET BY MOUTH EVERY DAY 30 tablet 11  . metoprolol tartrate (LOPRESSOR) 25 MG tablet Take 0.5 tablets (12.5 mg total) by mouth 2 (two) times daily. 180 tablet 3  . nitroGLYCERIN (NITROSTAT) 0.4 MG SL tablet Place 1 tablet (0.4 mg total) under the tongue every 5 (five) minutes as needed for chest pain. 25 tablet 3   No current facility-administered medications for this visit.    No  Known Allergies  Social History   Social History  . Marital Status: Married    Spouse Name: N/A  . Number of Children: N/A  . Years of Education: N/A   Occupational History  . Not on file.   Social History Main Topics  . Smoking status: Never Smoker   . Smokeless tobacco: Never Used  . Alcohol Use: No  . Drug Use: No  . Sexual Activity: Yes   Other Topics Concern  . Not on file   Social History Narrative     Review of Systems: General: negative for chills, fever, night sweats or weight changes.  Cardiovascular: negative for chest pain, dyspnea on exertion, edema, orthopnea, palpitations, paroxysmal nocturnal dyspnea or shortness of breath Dermatological: negative for rash Respiratory: negative for cough or wheezing Urologic: negative for hematuria Abdominal: negative for nausea, vomiting, diarrhea, bright red blood per rectum, melena, or hematemesis Neurologic: negative for visual changes, syncope, or dizziness All other systems reviewed and are otherwise negative except as noted above.    Blood pressure 126/86, pulse 68, height 6' (1.829 m), weight 198 lb (89.812 kg).  General appearance: alert and no distress Neck: no adenopathy, no carotid bruit, no JVD, supple, symmetrical, trachea midline and thyroid not enlarged, symmetric, no tenderness/mass/nodules Lungs: clear to auscultation bilaterally Heart: regular rate and rhythm, S1, S2 normal, no murmur, click, rub or gallop Extremities: extremities normal, atraumatic, no cyanosis or edema  EKG not performed today  ASSESSMENT AND PLAN:  CAD- DMI RCA DES 06/10/11- residual 40-50% CFX, 30-40% LAD Mr. Shaune Pollack. Has a history of CAD status post acute inferior wall Monocryl infarction treated with PCI and stenting of his distal dominant RCA with a Promus drug-eluting stent (2.5 mm x 16 mm). He did have a 90% stenosis in the proximal acute marginal branch of the RCA and occluded mid LAD with an EF of 50% and mild inferoapical  hypokinesia. Echo performed 08/23/11 was entirely normal and Myoview stress test showed apical and septal scar without ischemia. He was admitted with chest pain on 05/23/15 and underwent cardiac catheterization by Dr. Clydene Fake revealing unchanged anatomy with widely patent stent and normal LV function. His enzymes were negative. His d-dimer was negative. His echo was normal and he was discharged home the same day. He has had no recurrent symptoms.  Hyperlipidemia History of hyperlipidemia on statin therapy. We will recheck a lipid and liver profile      Lorretta Harp MD [facility], [facility] 07/05/2015 10:39 AM

## 2015-07-18 ENCOUNTER — Other Ambulatory Visit: Payer: Self-pay | Admitting: Cardiovascular Disease

## 2015-07-18 NOTE — Telephone Encounter (Signed)
Rx(s) sent to pharmacy electronically.  

## 2015-07-22 ENCOUNTER — Other Ambulatory Visit: Payer: Self-pay | Admitting: Cardiovascular Disease

## 2015-08-09 LAB — LIPID PANEL
Cholesterol: 131 mg/dL (ref 125–200)
HDL: 36 mg/dL — ABNORMAL LOW (ref >=40)
LDL CALC: 83 mg/dL (ref <130)
TRIGLYCERIDES: 62 mg/dL (ref <150)
Total CHOL/HDL Ratio: 3.6 Ratio (ref <=5.0)
VLDL: 12 mg/dL (ref <30)

## 2015-08-09 LAB — HEPATIC FUNCTION PANEL
ALBUMIN: 4.1 g/dL (ref 3.6–5.1)
ALT: 19 U/L (ref 9–46)
AST: 18 U/L (ref 10–35)
Alkaline Phosphatase: 57 U/L (ref 40–115)
BILIRUBIN INDIRECT: 0.5 mg/dL (ref 0.2–1.2)
Bilirubin, Direct: 0.1 mg/dL (ref <=0.2)
TOTAL PROTEIN: 6.6 g/dL (ref 6.1–8.1)
Total Bilirubin: 0.6 mg/dL (ref 0.2–1.2)

## 2015-08-14 ENCOUNTER — Other Ambulatory Visit: Payer: Self-pay | Admitting: Cardiovascular Disease

## 2016-02-25 ENCOUNTER — Other Ambulatory Visit: Payer: Self-pay | Admitting: Cardiovascular Disease

## 2016-03-04 ENCOUNTER — Other Ambulatory Visit: Payer: Self-pay | Admitting: Cardiovascular Disease

## 2016-03-24 ENCOUNTER — Other Ambulatory Visit: Payer: Self-pay | Admitting: Cardiovascular Disease

## 2016-03-27 ENCOUNTER — Other Ambulatory Visit: Payer: Self-pay

## 2016-03-27 MED ORDER — CLOPIDOGREL BISULFATE 75 MG PO TABS
75.0000 mg | ORAL_TABLET | Freq: Every day | ORAL | 0 refills | Status: DC
Start: 2016-03-27 — End: 2016-07-21

## 2016-04-25 ENCOUNTER — Other Ambulatory Visit: Payer: Self-pay | Admitting: Cardiovascular Disease

## 2016-06-20 ENCOUNTER — Other Ambulatory Visit: Payer: Self-pay | Admitting: Cardiovascular Disease

## 2016-06-24 ENCOUNTER — Other Ambulatory Visit: Payer: Self-pay | Admitting: Cardiovascular Disease

## 2016-07-04 ENCOUNTER — Encounter: Payer: Self-pay | Admitting: Cardiovascular Disease

## 2016-07-04 ENCOUNTER — Ambulatory Visit (INDEPENDENT_AMBULATORY_CARE_PROVIDER_SITE_OTHER): Payer: BLUE CROSS/BLUE SHIELD | Admitting: Cardiovascular Disease

## 2016-07-04 VITALS — BP 140/87 | HR 50 | Ht 72.0 in | Wt 201.6 lb

## 2016-07-04 DIAGNOSIS — I209 Angina pectoris, unspecified: Secondary | ICD-10-CM | POA: Diagnosis not present

## 2016-07-04 DIAGNOSIS — I2510 Atherosclerotic heart disease of native coronary artery without angina pectoris: Secondary | ICD-10-CM

## 2016-07-04 DIAGNOSIS — I2583 Coronary atherosclerosis due to lipid rich plaque: Secondary | ICD-10-CM

## 2016-07-04 DIAGNOSIS — E785 Hyperlipidemia, unspecified: Secondary | ICD-10-CM | POA: Diagnosis not present

## 2016-07-04 NOTE — Progress Notes (Signed)
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 He is status post acute inferior-wall myocardial infarction 06/10/11 treated with PCI and stenting of his distal dominant RCA with a Promus Element drug-eluting stent (2.5 x 16 mm long). He did have 90% stenosis in the proximal acute marginal branch of the RCA with an EF of 50% and mild inferoapical hypokinesia. His peak CPK was 926 with an MB of 48 and a troponin of 15. He has had no recurrent symptoms. Echo performed August 23, 2011, was entirely normal and a Myoview showed apical and septal scar without ischemia. He was admitted to [facility] on 05/23/15 with chest pain. He ruled out for myocardial infarction. He underwent cardiac catheterization by Dr. Prince Rome revealing unchanged anatomy with a patent distal RCA stent, scattered disease otherwise with a total mid LAD which was old, left left collaterals and normal LV function. His echo was normal. His d-dimer was negative and he was discharged home the same day. He has had no recurrent symptoms since I saw him a year ago.   Current Outpatient Prescriptions  Medication Sig Dispense Refill  . aspirin 81 MG tablet Take 81 mg by mouth daily.    Marland Kitchen atorvastatin (LIPITOR) 80 MG tablet TAKE 1/2 TABLET BY MOUTH EVERY DAY 45 tablet 0  . clopidogrel (PLAVIX) 75 MG tablet Take 1 tablet (75 mg total) by mouth daily. 90 tablet 0  . metoprolol tartrate (LOPRESSOR) 25 MG tablet TAKE 1/2 TABLET BY MOUTH TWICE DAILY 30 tablet 0  . nitroGLYCERIN (NITROSTAT) 0.4 MG SL tablet Place 1 tablet (0.4 mg total) under the tongue every 5 (five) minutes as needed for chest pain. 25 tablet 3   No current facility-administered medications for this visit.       No Known Allergies  Social History   Social History  . Marital status: Married    Spouse name: N/A  . Number of children: N/A  . Years of education: N/A   Occupational History  . Not on file.   Social History Main Topics  . Smoking status: Never Smoker  . Smokeless tobacco: Never Used  . Alcohol use No  . Drug use: No  . Sexual activity: Yes   Other Topics Concern  . Not on file   Social History Narrative  . No narrative on file     Review of Systems: General: negative for chills, fever, night sweats or weight changes.  Cardiovascular: negative for chest pain, dyspnea on exertion, edema, orthopnea, palpitations, paroxysmal nocturnal dyspnea or shortness of breath Dermatological: negative for rash Respiratory: negative for cough or wheezing Urologic: negative for hematuria Abdominal: negative for nausea, vomiting, diarrhea, bright red blood per rectum, melena, or hematemesis Neurologic: negative for visual changes, syncope, or dizziness All other systems reviewed and are otherwise negative except as noted above.    Blood pressure 140/87, pulse (!) 50, height 6' (1.829 m), weight 201 lb 9.6 oz (91.4 kg).  General appearance: alert and no distress Neck: no adenopathy, no carotid bruit, no JVD, supple, symmetrical, trachea midline and thyroid not enlarged, symmetric, no tenderness/mass/nodules Lungs: clear to auscultation bilaterally Heart: regular rate and rhythm, S1, S2 normal, no murmur, click, rub or gallop Extremities: extremities normal, atraumatic,  no cyanosis or edema  EKG sinus bradycardia at 50 with first-degree AV block. I personally reviewed this EKG.  ASSESSMENT AND PLAN:   CAD- DMI RCA DES 06/10/11- residual 40-50% CFX, 30-40% LAD History of CAD status post inferior STEMI 06/10/11 treated with drug-eluting stenting to the RCA. Did have residual moderate circumflex and LAD disease with normal LV function EF 50% with mild inferoapical hypokinesia. He was  restudied 05/23/15 because of recurrent chest pain by Dr. Julianne Handler  revealing in-stent with unchanged anatomy. He has had no symptoms since I last saw him.  Hyperlipidemia History of hyperlipidemia on statin therapy followed by his PCP.      Lorretta Harp MD FACP,FACC,FAHA, [facility] 07/04/2016 9:24 AM

## 2016-07-04 NOTE — Assessment & Plan Note (Signed)
History of CAD status post inferior STEMI 06/10/11 treated with drug-eluting stenting to the RCA. Did have residual moderate circumflex and LAD disease with normal LV function EF 50% with mild inferoapical hypokinesia. He was restudied 05/23/15 because of recurrent chest pain by Dr. Julianne Handler  revealing in-stent with unchanged anatomy. He has had no symptoms since I last saw him.

## 2016-07-04 NOTE — Patient Instructions (Signed)
Medication Instructions: Your physician recommends that you continue on your current medications as directed. Please refer to the Current Medication list given to you today.  Labwork: I will request recent labs from Dr. Mitchell.  Follow-Up: Your physician wants you to follow-up in: 1 year with Dr. Berry. You will receive a reminder letter in the mail two months in advance. If you don't receive a letter, please call our office to schedule the follow-up appointment.  If you need a refill on your cardiac medications before your next appointment, please call your pharmacy.  

## 2016-07-04 NOTE — Assessment & Plan Note (Signed)
History of hyperlipidemia on statin therapy followed by his PCP 

## 2016-07-21 ENCOUNTER — Other Ambulatory Visit: Payer: Self-pay | Admitting: Cardiovascular Disease

## 2016-12-20 IMAGING — CR DG CHEST 2V
2 series · 2 of 2 positions shown · non-contrast
Comparison: Chest radiograph June 10, 2011

CLINICAL DATA: Mid chest pain for 4 days, history of myocardial
infarction and coronary artery stent.

EXAM:
CHEST  2 VIEW

[chest pa]
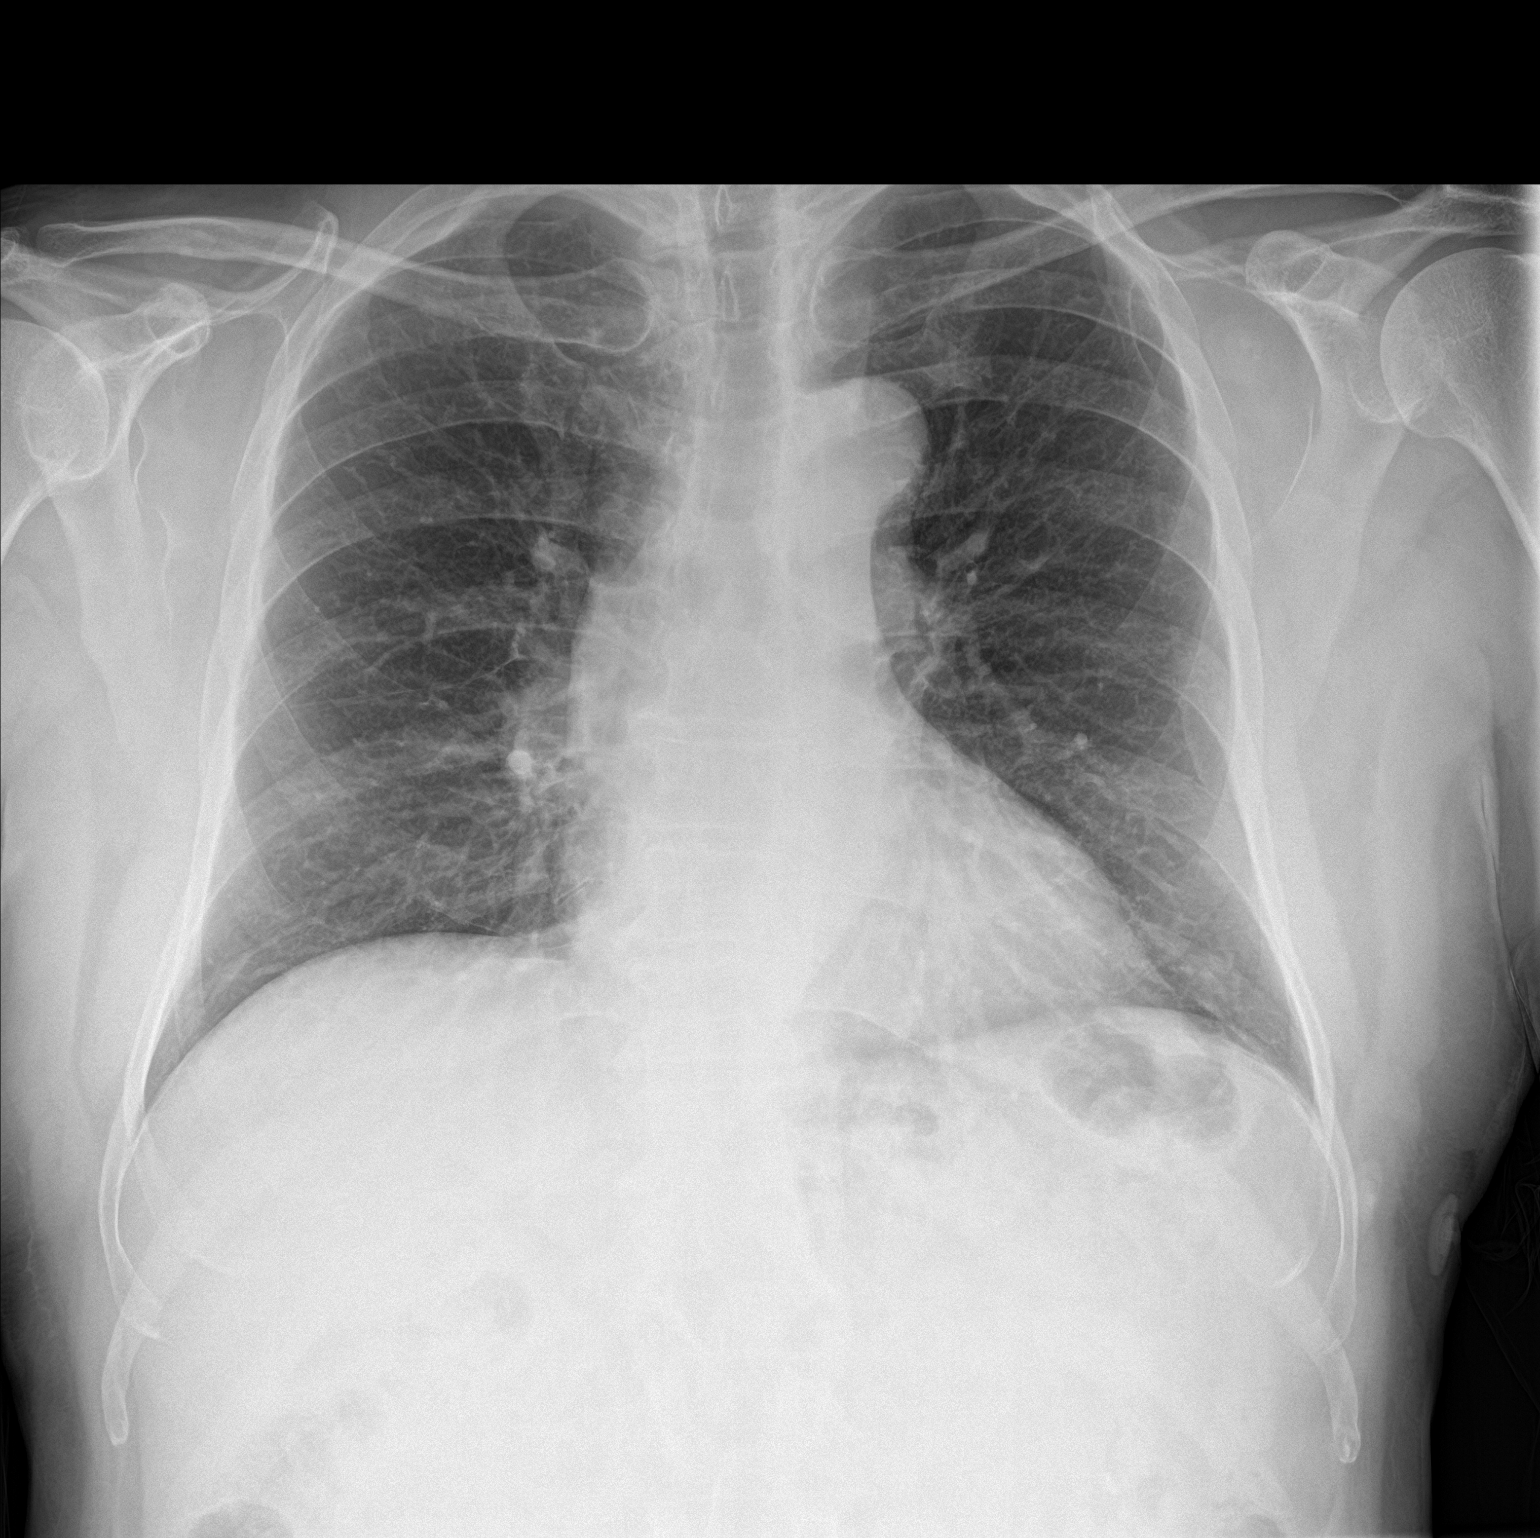

[chest lat]
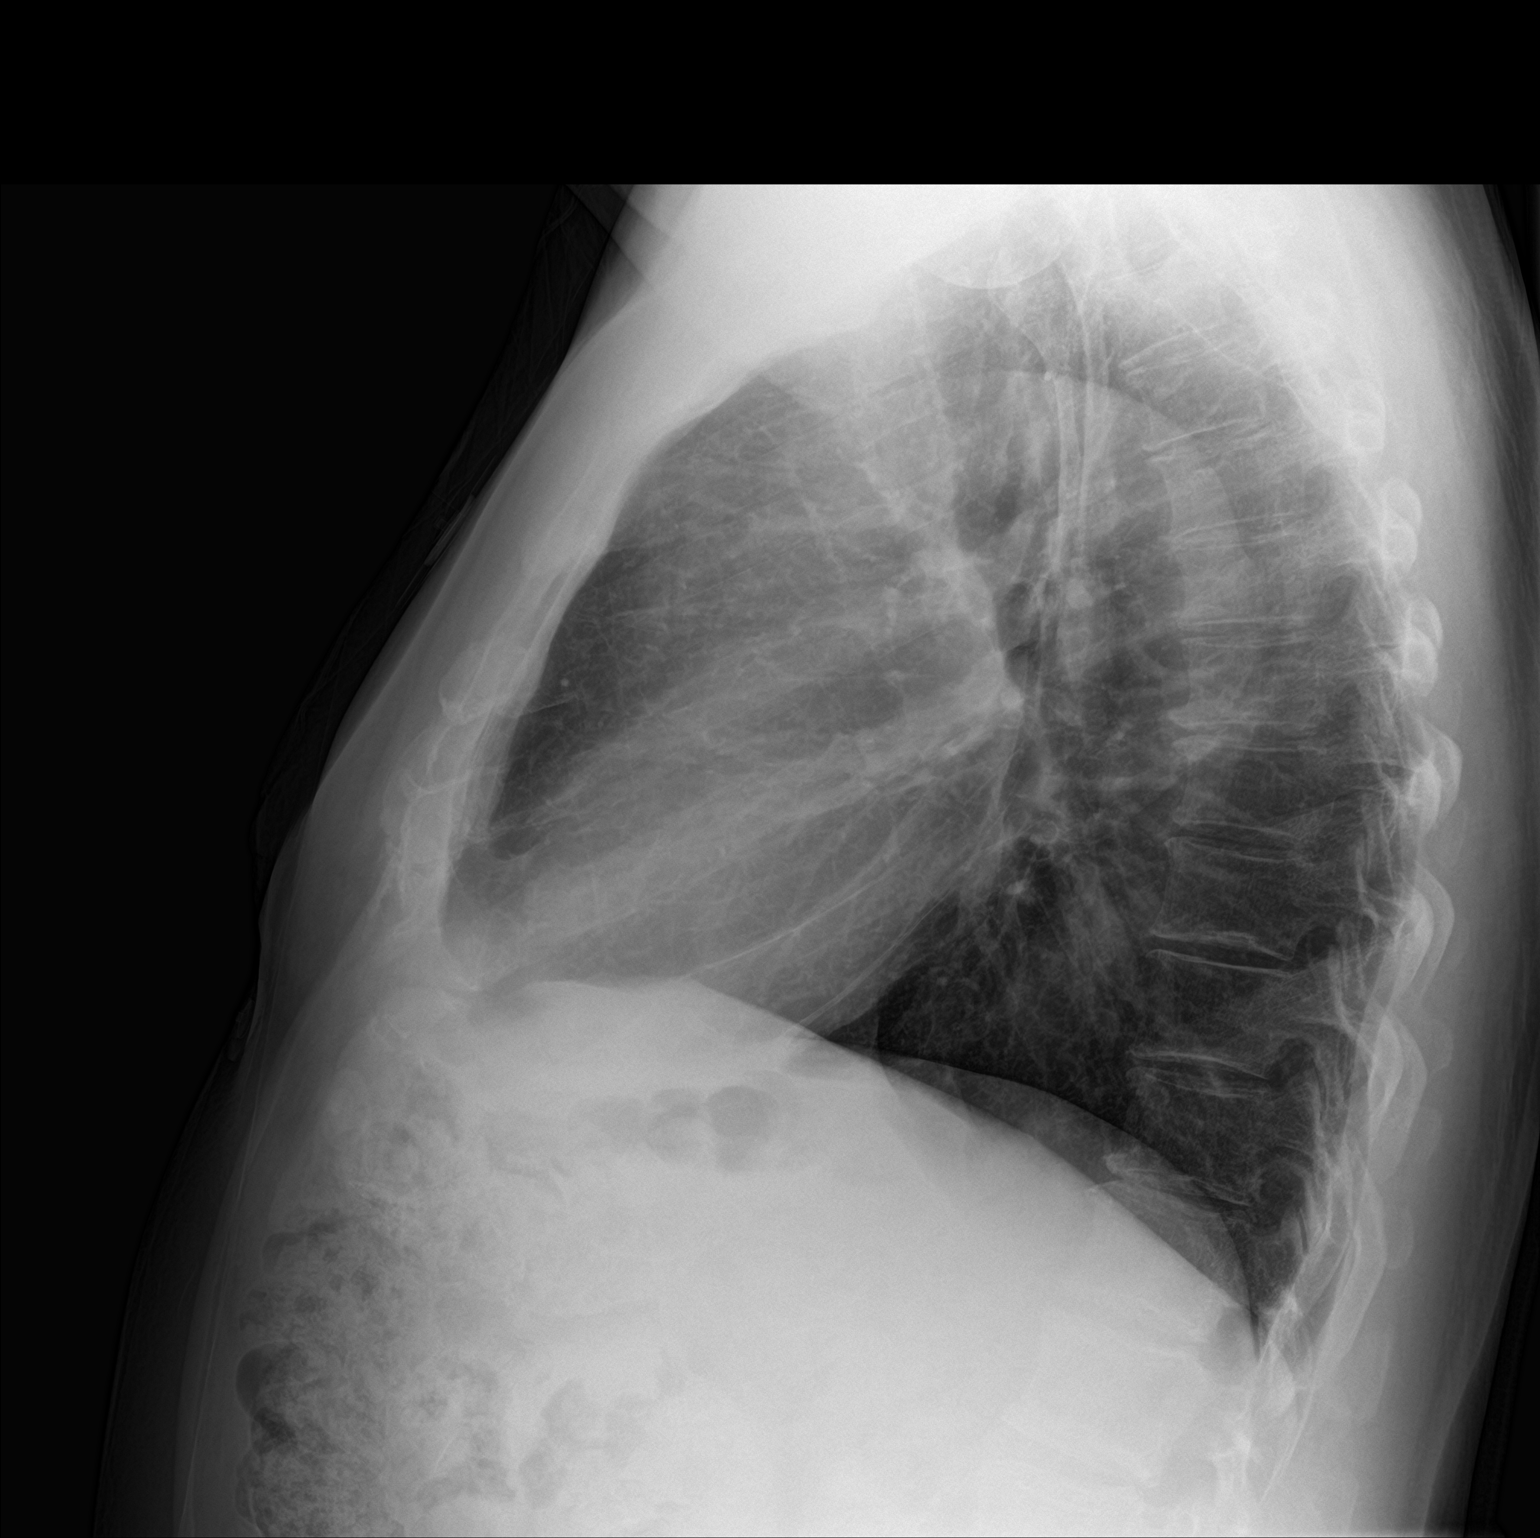

[2 of 2 positions shown; findings below may reference images not displayed]

FINDINGS: Cardiomediastinal silhouette is normal. Mildly tortuous aorta, mild
link calcified aortic arch. The lungs are clear without pleural
effusions or focal consolidations. Trachea projects midline and
there is no pneumothorax. Soft tissue planes and included osseous
structures are non-suspicious.
IMPRESSION: No acute cardiopulmonary process.

## 2017-01-29 ENCOUNTER — Other Ambulatory Visit: Payer: Self-pay | Admitting: Student

## 2017-01-29 ENCOUNTER — Ambulatory Visit
Admission: RE | Admit: 2017-01-29 | Discharge: 2017-01-29 | Disposition: A | Discharge status: Home / Self Care | Payer: PPO | Source: Ambulatory Visit | Attending: Student | Admitting: Student

## 2017-01-29 DIAGNOSIS — M7989 Other specified soft tissue disorders: Secondary | ICD-10-CM | POA: Diagnosis not present

## 2017-04-23 ENCOUNTER — Other Ambulatory Visit: Payer: Self-pay | Admitting: Cardiovascular Disease

## 2017-05-30 DIAGNOSIS — E7800 Pure hypercholesterolemia, unspecified: Secondary | ICD-10-CM | POA: Diagnosis not present

## 2017-05-30 DIAGNOSIS — I2510 Atherosclerotic heart disease of native coronary artery without angina pectoris: Secondary | ICD-10-CM | POA: Diagnosis not present

## 2017-05-30 DIAGNOSIS — Z0000 Encounter for general adult medical examination without abnormal findings: Secondary | ICD-10-CM | POA: Diagnosis not present

## 2017-05-30 DIAGNOSIS — Z23 Encounter for immunization: Secondary | ICD-10-CM | POA: Diagnosis not present

## 2017-05-30 DIAGNOSIS — M79674 Pain in right toe(s): Secondary | ICD-10-CM | POA: Diagnosis not present

## 2017-05-30 DIAGNOSIS — Z125 Encounter for screening for malignant neoplasm of prostate: Secondary | ICD-10-CM | POA: Diagnosis not present

## 2017-06-06 ENCOUNTER — Other Ambulatory Visit: Payer: Self-pay | Admitting: Cardiovascular Disease

## 2017-07-08 ENCOUNTER — Other Ambulatory Visit: Payer: Self-pay | Admitting: Cardiovascular Disease

## 2017-07-09 ENCOUNTER — Ambulatory Visit: Payer: PPO | Admitting: Cardiovascular Disease

## 2017-07-09 ENCOUNTER — Encounter: Payer: Self-pay | Admitting: Cardiovascular Disease

## 2017-07-09 DIAGNOSIS — E7800 Pure hypercholesterolemia, unspecified: Secondary | ICD-10-CM

## 2017-07-09 DIAGNOSIS — I2510 Atherosclerotic heart disease of native coronary artery without angina pectoris: Secondary | ICD-10-CM | POA: Diagnosis not present

## 2017-07-09 MED ORDER — METOPROLOL TARTRATE 25 MG PO TABS: 12.5000 mg | ORAL_TABLET | Freq: Two times a day (BID) | ORAL | 3 refills | Status: DC

## 2017-07-09 MED ORDER — ATORVASTATIN CALCIUM 80 MG PO TABS: 40.0000 mg | ORAL_TABLET | Freq: Every day | ORAL | 3 refills | Status: DC

## 2017-07-09 MED ORDER — CLOPIDOGREL BISULFATE 75 MG PO TABS: 75.0000 mg | ORAL_TABLET | Freq: Every day | ORAL | 3 refills | Status: DC

## 2017-07-09 NOTE — Assessment & Plan Note (Signed)
History of CAD status post inferior wall myocardial infarction 06/10/11 treated with PCI and stenting with distal dominant RCA Promus drug-eluting stent (2.5 mm x 16 mm long). He did have a 90% stenosis in the proximal marginal branch of the RCA in the 50% and mild inferoapical hypokinesia. His peak CPK was 926 with an MB of 48 and troponin of 15. He did have repeat cardiac catheterization by Dr. Angelena Form 05/23/15 revealing unchanged anatomy with the RCA stent, total mid LAD is old which filled by collaterals and normal LV function. He denies chest pain or shortness of breath.

## 2017-07-09 NOTE — Patient Instructions (Signed)

## 2017-07-09 NOTE — Addendum Note (Signed)
Addended by: Therisa Doyne on: 07/09/2017 09:41 AM   Modules accepted: Orders

## 2017-07-09 NOTE — Progress Notes (Signed)
07/09/2017 TRAE BOVENZI   07/03/1950  [id number]  Primary Physician Alroy Dust, L.Marlou Sa, MD Primary Cardiologist: Lorretta Harp MD Lupe Carney, [place]  HPI:  Dylan Johnson is a 67 y.o.  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 He is status post acute inferior-wall myocardial infarction 06/10/11 treated with PCI and stenting of his distal dominant RCA with a Promus Element drug-eluting stent (2.5 x 16 mm long). He did have 90% stenosis in the proximal acute marginal branch of the RCA with an EF of 50% and mild inferoapical hypokinesia. His peak CPK was 926 with an MB of 48 and a troponin of 15. He has had no recurrent symptoms. Echo performed August 23, 2011, was entirely normal and a Myoview showed apical and septal scar without ischemia. He was admitted to [facility] on 05/23/15 with chest pain. He ruled out for myocardial infarction. He underwent cardiac catheterization by Dr. Prince Rome revealing unchanged anatomy with a patent distal RCA stent, scattered disease otherwise with a total mid LAD which was old, left left collaterals and normal LV function. His echo was normal. His d-dimer was negative and he was discharged home the same day. He has had no recurrent symptoms since I saw him a year ago.      Current Meds  Medication Sig  . aspirin 81 MG tablet Take 81 mg by mouth daily.  Marland Kitchen atorvastatin (LIPITOR) 80 MG tablet TAKE 1/2 TABLET BY MOUTH EVERY DAY  . clopidogrel (PLAVIX) 75 MG tablet TAKE 1 TABLET(75 MG) BY MOUTH DAILY  . metoprolol tartrate (LOPRESSOR) 25 MG tablet TAKE 1/2 TABLET BY MOUTH TWICE DAILY  . nitroGLYCERIN (NITROSTAT) 0.4 MG SL tablet Place 1 tablet (0.4 mg total) under the tongue every 5 (five) minutes as needed for chest pain.     No Known Allergies  Social History   Socioeconomic History  . Marital status: Married    Spouse name: Not on file  . Number of  children: Not on file  . Years of education: Not on file  . Highest education level: Not on file  Social Needs  . Financial resource strain: Not on file  . Food insecurity - worry: Not on file  . Food insecurity - inability: Not on file  . Transportation needs - medical: Not on file  . Transportation needs - non-medical: Not on file  Occupational History  . Not on file  Tobacco Use  . Smoking status: Never Smoker  . Smokeless tobacco: Never Used  Substance and Sexual Activity  . Alcohol use: No  . Drug use: No  . Sexual activity: Yes  Other Topics Concern  . Not on file  Social History Narrative  . Not on file     Review of Systems: General: negative for chills, fever, night sweats or weight changes.  Cardiovascular: negative for chest pain, dyspnea on exertion, edema, orthopnea, palpitations, paroxysmal nocturnal dyspnea or shortness of breath Dermatological: negative for rash Respiratory: negative for cough or wheezing Urologic: negative for hematuria Abdominal: negative for nausea, vomiting, diarrhea, bright red blood per rectum, melena, or hematemesis Neurologic: negative for visual changes, syncope, or dizziness All other systems reviewed and are otherwise negative except as noted above.    Blood pressure (!) 144/90, pulse (!) 51, height 5' 11.5" (1.816 m), weight 196 lb 3.2 oz (89 kg).  General appearance: alert and no distress Neck: no adenopathy,  no carotid bruit, no JVD, supple, symmetrical, trachea midline and thyroid not enlarged, symmetric, no tenderness/mass/nodules Lungs: clear to auscultation bilaterally Heart: regular rate and rhythm, S1, S2 normal, no murmur, click, rub or gallop Extremities: extremities normal, atraumatic, no cyanosis or edema Pulses: 2+ and symmetric Skin: Skin color, texture, turgor normal. No rashes or lesions Neurologic: Alert and oriented X 3, normal strength and tone. Normal symmetric reflexes. Normal coordination and gait  EKG  sinus bradycardia at 51 with early R-wave transition. I personally reviewed this EKG.  ASSESSMENT AND PLAN:   CAD- DMI RCA DES 06/10/11- residual 40-50% CFX, 30-40% LAD History of CAD status post inferior wall myocardial infarction 06/10/11 treated with PCI and stenting with distal dominant RCA Promus drug-eluting stent (2.5 mm x 16 mm long). He did have a 90% stenosis in the proximal marginal branch of the RCA in the 50% and mild inferoapical hypokinesia. His peak CPK was 926 with an MB of 48 and troponin of 15. He did have repeat cardiac catheterization by Dr. Angelena Form 05/23/15 revealing unchanged anatomy with the RCA stent, total mid LAD is old which filled by collaterals and normal LV function. He denies chest pain or shortness of breath.  Hyperlipidemia History of hyperlipidemia on statin therapy followed by his PCP      Lorretta Harp MD [facility], [facility] 07/09/2017 9:37 AM

## 2017-07-09 NOTE — Assessment & Plan Note (Signed)
History of hyperlipidemia on statin therapy followed by his PCP 

## 2017-07-09 NOTE — Addendum Note (Signed)
Addended by: Zebedee Iba on: 07/09/2017 11:32 AM   Modules accepted: Orders

## 2017-07-16 ENCOUNTER — Other Ambulatory Visit: Payer: Self-pay | Admitting: *Deleted

## 2017-07-16 ENCOUNTER — Telehealth: Payer: Self-pay | Admitting: Cardiovascular Disease

## 2017-07-16 MED ORDER — NITROGLYCERIN 0.4 MG SL SUBL: 0.4000 mg | SUBLINGUAL_TABLET | SUBLINGUAL | 6 refills | Status: DC | PRN

## 2017-07-16 NOTE — Telephone Encounter (Signed)
New message    *STAT* If patient is at the pharmacy, call can be transferred to refill team.   1. Which medications need to be refilled? (please list name of each medication and dose if known) nitroGLYCERIN (NITROSTAT) 0.4 MG SL tablet  2. Which pharmacy/location (including street and city if local pharmacy) is medication to be sent to?[facility] [place], [address]  3. Do they need a 30 day or 90 day supply? [place]

## 2017-08-12 DIAGNOSIS — L570 Actinic keratosis: Secondary | ICD-10-CM | POA: Diagnosis not present

## 2017-08-12 DIAGNOSIS — L821 Other seborrheic keratosis: Secondary | ICD-10-CM | POA: Diagnosis not present

## 2017-08-12 DIAGNOSIS — Z87898 Personal history of other specified conditions: Secondary | ICD-10-CM | POA: Diagnosis not present

## 2017-08-12 DIAGNOSIS — D225 Melanocytic nevi of trunk: Secondary | ICD-10-CM | POA: Diagnosis not present

## 2017-08-18 DIAGNOSIS — S46812A Strain of other muscles, fascia and tendons at shoulder and upper arm level, left arm, initial encounter: Secondary | ICD-10-CM | POA: Diagnosis not present

## 2017-08-19 DIAGNOSIS — M5412 Radiculopathy, cervical region: Secondary | ICD-10-CM | POA: Diagnosis not present

## 2017-08-19 DIAGNOSIS — M25512 Pain in left shoulder: Secondary | ICD-10-CM | POA: Diagnosis not present

## 2017-08-19 DIAGNOSIS — M5030 Other cervical disc degeneration, unspecified cervical region: Secondary | ICD-10-CM | POA: Diagnosis not present

## 2017-08-27 ENCOUNTER — Telehealth: Payer: Self-pay | Admitting: Cardiovascular Disease

## 2017-08-27 DIAGNOSIS — M5412 Radiculopathy, cervical region: Secondary | ICD-10-CM | POA: Diagnosis not present

## 2017-08-27 NOTE — Telephone Encounter (Signed)
New message    Patient calling to confirm he can get a MRI TODAY; patient had stent placed.

## 2017-08-27 NOTE — Telephone Encounter (Signed)
Return called to Pt and advised that Per Dr. Al Pimple) he should be ok to have MRI done.

## 2017-08-28 DIAGNOSIS — M542 Cervicalgia: Secondary | ICD-10-CM | POA: Diagnosis not present

## 2017-08-28 DIAGNOSIS — M25512 Pain in left shoulder: Secondary | ICD-10-CM | POA: Diagnosis not present

## 2017-09-02 ENCOUNTER — Telehealth: Payer: Self-pay | Admitting: *Deleted

## 2017-09-02 NOTE — Telephone Encounter (Signed)
   Primary Cardiologist: Quay Burow, MD  Chart reviewed as part of pre-operative protocol coverage. Patient was contacted 09/02/2017 in reference to pre-operative risk assessment for pending surgery as outlined below.  Dylan Johnson was last seen on 07/09/17 by Dr. Gwenlyn Found.  Since that day, Dylan Johnson has done well. He is able to get at least 7.59 METS of acitivity.   Therefore, based on ACC/AHA guidelines, the patient would be at acceptable risk for the planned procedure without further cardiovascular testing.   Dr. Gwenlyn Found, Please give recommendations of antiplatelet therapy. Last cardiac catheterization by Dr. Angelena Form 05/23/15 revealing unchanged anatomy with the RCA stent, total mid LAD is old which filled by collaterals and normal LV function.   Please forward your response to P CV DIV PREOP. Thank you   Leanor Kail, PA 09/02/2017, 1:56 PM

## 2017-09-02 NOTE — Telephone Encounter (Signed)
   [facility] Pre-operative Risk Assessment    Request for surgical clearance:  1. What type of surgery is being performed? cervical ESI   2. When is this surgery scheduled? 09/19/17   3. What type of clearance is required (medical clearance vs. Pharmacy clearance to hold med vs. Both)? both  4. Are there any medications that need to be held prior to surgery and how long? Plavix and Aspirin for 5 days   5. Practice name and name of physician performing surgery? [facility]    6. What is your office phone number [phone] ext [phone]    7.   What is your office fax number [phone]  8.   Anesthesia type (None, local, MAC, general) ?    Dylan Johnson 09/02/2017, 11:47 AM  _________________________________________________________________   (provider comments below)

## 2017-09-10 NOTE — Telephone Encounter (Signed)
   Primary Cardiologist:Jonathan Gwenlyn Found, MD  Still waiting on input regarding holding ASA and Plavix for upcoming epidural steroid injection.  Richardson Dopp, PA-C  09/10/2017, 3:44 PM

## 2017-09-11 NOTE — Telephone Encounter (Signed)
Okay to interrupt antiplatelet therapy if necessary

## 2017-11-11 DIAGNOSIS — L259 Unspecified contact dermatitis, unspecified cause: Secondary | ICD-10-CM | POA: Diagnosis not present

## 2017-11-11 DIAGNOSIS — L0390 Cellulitis, unspecified: Secondary | ICD-10-CM | POA: Diagnosis not present

## 2018-02-28 DIAGNOSIS — H02831 Dermatochalasis of right upper eyelid: Secondary | ICD-10-CM | POA: Diagnosis not present

## 2018-02-28 DIAGNOSIS — H43812 Vitreous degeneration, left eye: Secondary | ICD-10-CM | POA: Diagnosis not present

## 2018-02-28 DIAGNOSIS — H2513 Age-related nuclear cataract, bilateral: Secondary | ICD-10-CM | POA: Diagnosis not present

## 2018-02-28 DIAGNOSIS — Z9889 Other specified postprocedural states: Secondary | ICD-10-CM | POA: Diagnosis not present

## 2018-04-01 DIAGNOSIS — L821 Other seborrheic keratosis: Secondary | ICD-10-CM | POA: Diagnosis not present

## 2018-04-01 DIAGNOSIS — L570 Actinic keratosis: Secondary | ICD-10-CM | POA: Diagnosis not present

## 2018-04-01 DIAGNOSIS — D225 Melanocytic nevi of trunk: Secondary | ICD-10-CM | POA: Diagnosis not present

## 2018-04-01 DIAGNOSIS — Z87898 Personal history of other specified conditions: Secondary | ICD-10-CM | POA: Diagnosis not present

## 2018-04-01 DIAGNOSIS — D1723 Benign lipomatous neoplasm of skin and subcutaneous tissue of right leg: Secondary | ICD-10-CM | POA: Diagnosis not present

## 2018-04-11 ENCOUNTER — Emergency Department (HOSPITAL_COMMUNITY)
Admission: EM | Admit: 2018-04-11 | Discharge: 2018-04-11 | Disposition: A | Discharge status: Home / Self Care | Payer: PPO | Attending: Emergency Medicine | Admitting: Emergency Medicine

## 2018-04-11 ENCOUNTER — Emergency Department (HOSPITAL_COMMUNITY): Payer: PPO

## 2018-04-11 ENCOUNTER — Encounter (HOSPITAL_COMMUNITY): Payer: Self-pay | Admitting: Emergency Medicine

## 2018-04-11 ENCOUNTER — Other Ambulatory Visit: Payer: Self-pay

## 2018-04-11 DIAGNOSIS — Z7982 Long term (current) use of aspirin: Secondary | ICD-10-CM | POA: Insufficient documentation

## 2018-04-11 DIAGNOSIS — Z7902 Long term (current) use of antithrombotics/antiplatelets: Secondary | ICD-10-CM | POA: Diagnosis not present

## 2018-04-11 DIAGNOSIS — R079 Chest pain, unspecified: Secondary | ICD-10-CM

## 2018-04-11 DIAGNOSIS — Z79899 Other long term (current) drug therapy: Secondary | ICD-10-CM | POA: Diagnosis not present

## 2018-04-11 DIAGNOSIS — I2510 Atherosclerotic heart disease of native coronary artery without angina pectoris: Secondary | ICD-10-CM | POA: Insufficient documentation

## 2018-04-11 DIAGNOSIS — E785 Hyperlipidemia, unspecified: Secondary | ICD-10-CM | POA: Diagnosis not present

## 2018-04-11 DIAGNOSIS — I252 Old myocardial infarction: Secondary | ICD-10-CM | POA: Insufficient documentation

## 2018-04-11 HISTORY — DX: Acute myocardial infarction, unspecified: I21.9

## 2018-04-11 LAB — BASIC METABOLIC PANEL
Anion gap: 8 (ref 5–15)
BUN: 15 mg/dL (ref 8–23)
CO2: 26 mmol/L (ref 22–32)
CREATININE: 1.16 mg/dL (ref 0.61–1.24)
Calcium: 9.4 mg/dL (ref 8.9–10.3)
Chloride: 105 mmol/L (ref 98–111)
GFR calc non Af Amer: >60 mL/min (ref >60)
Glucose, Bld: 94 mg/dL (ref 70–99)
Potassium: 3.8 mmol/L (ref 3.5–5.1)
Sodium: 139 mmol/L (ref 135–145)

## 2018-04-11 LAB — I-STAT TROPONIN, ED
TROPONIN I, POC: 0.01 ng/mL (ref 0.00–0.08)
Troponin i, poc: 0 ng/mL (ref 0.00–0.08)

## 2018-04-11 LAB — CBC
HCT: 50.4 % (ref 39.0–52.0)
Hemoglobin: 15.8 g/dL (ref 13.0–17.0)
MCH: 28 pg (ref 26.0–34.0)
MCHC: 31.3 g/dL (ref 30.0–36.0)
MCV: 89.4 fL (ref 80.0–100.0)
NRBC: 0 % (ref 0.0–0.2)
Platelets: 250 10*3/uL (ref 150–400)
RBC: 5.64 MIL/uL (ref 4.22–5.81)
RDW: 13.2 % (ref 11.5–15.5)
WBC: 8 10*3/uL (ref 4.0–10.5)

## 2018-04-11 MED ORDER — ISOSORBIDE MONONITRATE ER 30 MG PO TB24: 30.0000 mg | ORAL_TABLET | Freq: Every day | ORAL | 0 refills | Status: DC

## 2018-04-11 MED ORDER — MORPHINE SULFATE (PF) 2 MG/ML IV SOLN
2.0000 mg | Freq: Once | INTRAVENOUS | Status: AC
  Administered 2018-04-11: 2 mg via INTRAVENOUS
  Filled 2018-04-11: qty 1

## 2018-04-11 NOTE — Discharge Instructions (Signed)
Begin taking Imdur as prescribed.  Follow-up with your cardiologist in the next week, and return to the ER if your symptoms worsen or change.

## 2018-04-11 NOTE — ED Provider Notes (Signed)
Dylan Johnson   CSN: [id number] Arrival date & time: 04/11/18  1922     History   Chief Complaint Chief Complaint  Patient presents with  . Chest Pain    HPI Dylan Johnson is a 67 y.o. male.  Patient is a 67 year old male with history of coronary artery disease with prior stent placement.  He presents today for evaluation of chest discomfort.  This started this afternoon while he was shopping with his wife.  He describes a pressure to the center of his chest along with what he describes as a "dull ache".  This sensation radiates to the left shoulder, but is not associated with any shortness of breath, nausea, or diaphoresis.  He denies any recent exertional symptoms.  His cardiologist is Dr. Alvester Chou.  He had a stent placed in 2013, then repeat catheterization in 2017 during which time no restenting was necessary.  The history is provided by the patient.  Chest Pain   This is a new problem. Episode onset: 2:30 PM. The problem occurs constantly. The problem has not changed since onset.The pain is present in the substernal region. The pain is at a severity of 3/10. The pain is mild. The quality of the pain is described as dull. The pain radiates to the left shoulder. Pertinent negatives include no cough, no diaphoresis, no fever and no nausea. He has tried nothing for the symptoms.    Past Medical History:  Diagnosis Date  . Arthritis   . Coronary artery disease 06/10/11   DMI RCA DES  . Hyperlipidemia   . Myocardial infarct ([place]) 2013  . Observation for suspected cardiovascular disease     Patient Active Problem List   Diagnosis Date Noted  . Angina pectoris ([place]) 05/23/2015  . Coronary artery disease involving native coronary artery of native heart with unstable angina pectoris ([place])   . Chest pain 05/29/2013  . Family history of coronary artery disease 05/29/2013  . CAD- DMI RCA DES 06/10/11- residual 40-50% CFX, 30-40%  LAD 03/19/2013  . Hyperlipidemia 03/19/2013    Past Surgical History:  Procedure Laterality Date  . ANGIOPLASTY  06/10/11   DMI-RCA DES  . CARDIAC CATHETERIZATION N/A 05/23/2015   Procedure: Left Heart Cath and Coronary Angiography;  Surgeon: Burnell Blanks, MD;  Location: [facility];  Service: Cardiovascular;  Laterality: N/A;  . HERNIA REPAIR    . LASIK  2002  . LEFT HEART CATHETERIZATION WITH CORONARY ANGIOGRAM N/A 06/10/2011   Procedure: LEFT HEART CATHETERIZATION WITH CORONARY ANGIOGRAM;  Surgeon: Lorretta Harp, MD;  Location: [facility] CATH LAB;  Service: Cardiovascular;  Laterality: N/A;  . NM MYOCAR PERF WALL MOTION  08/23/2011   protocol:[place], moderate ischemia in Piccal Spetal/Apical region. post EF59% low risk scan  . PERCUTANEOUS CORONARY STENT INTERVENTION (PCI-S)  06/10/2011   Procedure: PERCUTANEOUS CORONARY STENT INTERVENTION (PCI-S);  Surgeon: Lorretta Harp, MD;  Location: [facility] CATH LAB;  Service: Cardiovascular;;  . TRANSTHORACIC ECHOCARDIOGRAM  08/23/2011   EF = >55% prominent eustachian valve, normal echo        Home Medications    Prior to Admission medications   Medication Sig Start Date End Date Taking? Authorizing Provider  aspirin 81 MG tablet Take 81 mg by mouth daily.    [provider]  atorvastatin (LIPITOR) 80 MG tablet Take 0.5 tablets (40 mg total) by mouth daily. 07/09/17   Lorretta Harp, MD  clopidogrel (PLAVIX) 75 MG tablet Take 1  tablet (75 mg total) by mouth daily. 07/09/17   Lorretta Harp, MD  metoprolol tartrate (LOPRESSOR) 25 MG tablet Take 0.5 tablets (12.5 mg total) by mouth 2 (two) times daily. 07/09/17   Lorretta Harp, MD  nitroGLYCERIN (NITROSTAT) 0.4 MG SL tablet Place 1 tablet (0.4 mg total) under the tongue every 5 (five) minutes as needed for chest pain. 07/16/17   Lorretta Harp, MD    Family History Family History  Problem Relation Age of Onset  . Heart attack Mother   . Heart attack Father   .  Coronary artery disease Unknown   . Colon cancer Brother   . Brain cancer Son     Social History Social History   Tobacco Use  . Smoking status: Never Smoker  . Smokeless tobacco: Never Used  Substance Use Topics  . Alcohol use: No  . Drug use: No     Allergies   Patient has no known allergies.   Review of Systems Review of Systems  Constitutional: Negative for diaphoresis and fever.  Respiratory: Negative for cough.   Cardiovascular: Positive for chest pain.  Gastrointestinal: Negative for nausea.  All other systems reviewed and are negative.    Physical Exam Updated Vital Signs BP (!) 160/93 (BP Location: Right Arm)   Pulse (!) 54   Temp 98.4 F (36.9 C) (Oral)   Resp 11   Ht 5\' 11"  (1.803 m)   Wt 90.7 kg   SpO2 100%   BMI 27.89 kg/m   Physical Exam  Constitutional: He is oriented to person, place, and time. He appears well-developed and well-nourished. No distress.  HENT:  Head: Normocephalic and atraumatic.  Mouth/Throat: Oropharynx is clear and moist.  Neck: Normal range of motion. Neck supple.  Cardiovascular: Normal rate and regular rhythm. Exam reveals no friction rub.  No murmur heard. Pulmonary/Chest: Effort normal and breath sounds normal. No respiratory distress. He has no wheezes. He has no rales.  Abdominal: Soft. Bowel sounds are normal. He exhibits no distension. There is no tenderness.  Musculoskeletal: Normal range of motion.       Right lower leg: Normal. He exhibits no tenderness and no edema.       Left lower leg: Normal. He exhibits no tenderness and no edema.  Neurological: He is alert and oriented to person, place, and time. Coordination normal.  Skin: Skin is warm and dry. He is not diaphoretic.  Nursing Johnson and vitals reviewed.    ED Treatments / Results  Labs (all labs ordered are listed, but only abnormal results are displayed) Labs Reviewed  BASIC METABOLIC PANEL  CBC  I-STAT TROPONIN, ED    EKG EKG  Interpretation  Date/Time:  Friday April 11 2018 19:30:18 EST Ventricular Rate:  51 PR Interval:    QRS Duration: 87 QT Interval:  438 QTC Calculation: 404 R Axis:   -5 Text Interpretation:  Sinus rhythm Abnormal R-wave progression, early transition Left ventricular hypertrophy Borderline T abnormalities, inferior leads Confirmed by Veryl Speak [phone]) on 04/11/2018 7:38:05 PM   Radiology No results found.  Procedures Procedures (including critical care time)  Medications Ordered in ED Medications  morphine 2 MG/ML injection 2 mg (has no administration in time range)     Initial Impression / Assessment and Plan / ED Course  I have reviewed the triage vital signs and the nursing notes.  Pertinent labs & imaging results that were available during my care of the patient were reviewed by me and considered in  my medical decision making (see chart for details).  Patient presents with complaints of chest discomfort.  This started approximately 230 today while he was shopping with his wife.  He describes it as a pressure in his chest with an associated dull ache.  His initial EKG is unchanged from prior studies and troponin x2 is negative.  Care was discussed with the cardiology fellow on-call.  He agrees with my assessment that the patient is appropriate for discharge and outpatient follow-up.  He is now pain-free and feels much better.  He will be discharged with Imdur and follow up with his Cardiologist.  He agrees to return if symptoms significantly worsen or change.  Final Clinical Impressions(s) / ED Diagnoses   Final diagnoses:  None    ED Discharge Orders    None       Veryl Speak, MD 04/11/18 2301

## 2018-04-11 NOTE — ED Notes (Signed)
Dr. Delo at bedside. 

## 2018-04-11 NOTE — ED Triage Notes (Signed)
Pt reports CP onset earlier this afternoon. L sided, radiation into L arm. 3/10, dull/aching in nature. Cardiac hx, stents placed 2013, last cardiac cath 2017.

## 2018-04-15 ENCOUNTER — Telehealth: Payer: Self-pay | Admitting: Cardiovascular Disease

## 2018-04-15 NOTE — Telephone Encounter (Signed)
Spoke with pt, he reports pain was mild, 3 on a scale of 1 to 10. It was not really like the pain he had prior to getting stents but what concerned him was his bp was 165/105 and would not come down. Since going to the ER he has had no further chest pain or SOB and his bp is back to his normal. He is fine with waiting for the appointment in feb. Patient voiced understanding how to use his NTG and to call prior to feb appointment with problems.

## 2018-04-15 NOTE — Telephone Encounter (Signed)
°  Patient calling, he was seen in ED for chest pain. Patient states he is not having any symptoms but would like Dr Gwenlyn Found to review ED record and determine if needs additional testing or sooner appt.

## 2018-07-09 ENCOUNTER — Encounter: Payer: Self-pay | Admitting: Cardiovascular Disease

## 2018-07-09 ENCOUNTER — Ambulatory Visit: Payer: PPO | Admitting: Cardiovascular Disease

## 2018-07-09 VITALS — BP 136/85 | HR 52 | Ht 71.5 in | Wt 194.2 lb

## 2018-07-09 DIAGNOSIS — E7800 Pure hypercholesterolemia, unspecified: Secondary | ICD-10-CM

## 2018-07-09 DIAGNOSIS — I2510 Atherosclerotic heart disease of native coronary artery without angina pectoris: Secondary | ICD-10-CM | POA: Diagnosis not present

## 2018-07-09 LAB — HEPATIC FUNCTION PANEL
ALT: 22 IU/L (ref 0–44)
AST: 23 IU/L (ref 0–40)
Albumin: 4.7 g/dL (ref 3.8–4.8)
Alkaline Phosphatase: 72 IU/L (ref 39–117)
Bilirubin Total: 0.6 mg/dL (ref 0.0–1.2)
Bilirubin, Direct: 0.16 mg/dL (ref 0.00–0.40)
Total Protein: 6.7 g/dL (ref 6.0–8.5)

## 2018-07-09 LAB — LIPID PANEL
Chol/HDL Ratio: 3.7 ratio (ref 0.0–5.0)
Cholesterol, Total: 137 mg/dL (ref 100–199)
HDL: 37 mg/dL — ABNORMAL LOW (ref >39)
LDL Calculated: 85 mg/dL (ref 0–99)
TRIGLYCERIDES: 75 mg/dL (ref 0–149)
VLDL Cholesterol Cal: 15 mg/dL (ref 5–40)

## 2018-07-09 NOTE — Assessment & Plan Note (Signed)
History of CAD status post acute inferior wall myocardial infarction 06/10/2011 treated with PCI and stenting of his distal dominant RCA with a Promus element drug-eluting stent (2.5 mm x 16 mm long).  He did have a 90% stenosis in the proximal acute marginal branch of the RCA with an EF 50% and mild inferoapical hypokinesia.  His peak CPK was 926 with an MB of 48 and a troponin of 15.  He said no recurrent symptoms.  He did come in with recurrent chest pain 05/23/2015 and ruled out for myocardial infarction.  He underwent cardiac catheterization by Dr. Angelena Form revealing unchanged anatomy with a patent distal RCA stent and scattered disease otherwise with a total mid LAD which was old, left to left collaterals and normal LV function.  He has been asymptomatic since.

## 2018-07-09 NOTE — Progress Notes (Signed)
07/09/2018 ECHO [facility]   Mar 05, 1951  [id number]  Primary Physician Alroy Dust, L.Marlou Sa, MD Primary Cardiologist: Lorretta Harp MD Lupe Carney, [place]  HPI:  Dylan Johnson is a 68 y.o.  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 I last saw him  07/09/2017. He is status post acute inferior-wall myocardial infarction1/27/13treated with PCI and stenting of his distal dominant RCA with a Promus Element drug-eluting stent (2.5 x 16 mm long). He did have 90% stenosis in the proximal acute marginal branch of the RCA with an EF of 50% and mild inferoapical hypokinesia. His peak CPK was 926 with an MB of 48 and a troponin of 15. He has had no recurrent symptoms. Echo performed August 23, 2011, was entirely normal and a Myoview showed apical and septal scar without ischemia. He was admitted to [facility] on 05/23/15 with chest pain. He ruled out for myocardial infarction. He underwent cardiac catheterization by Dr. Prince Rome revealing unchanged anatomy with a patent distal RCA stent, scattered disease otherwise with a total mid LAD which was old, left left collaterals and normal LV function. His echo was normal. His d-dimer was negative and he was discharged home the same day.  Since I saw him a year ago he has been asymptomatic specifically denying chest pain or shortness of breath except for one episode when he had chest pain 04/11/2018 when he visited the emergency room and ruled out for myocardial infarction.  He has had no recurrent symptoms.  Current Meds  Medication Sig  . aspirin EC 81 MG tablet Take 81 mg by mouth daily.  Marland Kitchen atorvastatin (LIPITOR) 80 MG tablet Take 0.5 tablets (40 mg total) by mouth daily. (Patient taking differently: Take 40 mg by mouth daily at 6 PM. )  . clopidogrel (PLAVIX) 75 MG tablet Take 1 tablet (75 mg total) by mouth daily.  . metoprolol tartrate (LOPRESSOR) 25 MG tablet Take 0.5 tablets (12.5 mg  total) by mouth 2 (two) times daily.  . nitroGLYCERIN (NITROSTAT) 0.4 MG SL tablet Place 1 tablet (0.4 mg total) under the tongue every 5 (five) minutes as needed for chest pain.     No Known Allergies  Social History   Socioeconomic History  . Marital status: Married    Spouse name: Not on file  . Number of children: Not on file  . Years of education: Not on file  . Highest education level: Not on file  Occupational History  . Not on file  Social Needs  . Financial resource strain: Not on file  . Food insecurity:    Worry: Not on file    Inability: Not on file  . Transportation needs:    Medical: Not on file    Non-medical: Not on file  Tobacco Use  . Smoking status: Never Smoker  . Smokeless tobacco: Never Used  Substance and Sexual Activity  . Alcohol use: No  . Drug use: No  . Sexual activity: Yes  Lifestyle  . Physical activity:    Days per week: Not on file    Minutes per session: Not on file  . Stress: Not on file  Relationships  . Social connections:    Talks on phone: Not on file    Gets together: Not on file    Attends religious service: Not on file    Active member of club or organization: Not on file    Attends meetings of clubs  or organizations: Not on file    Relationship status: Not on file  . Intimate partner violence:    Fear of current or ex partner: Not on file    Emotionally abused: Not on file    Physically abused: Not on file    Forced sexual activity: Not on file  Other Topics Concern  . Not on file  Social History Narrative  . Not on file     Review of Systems: General: negative for chills, fever, night sweats or weight changes.  Cardiovascular: negative for chest pain, dyspnea on exertion, edema, orthopnea, palpitations, paroxysmal nocturnal dyspnea or shortness of breath Dermatological: negative for rash Respiratory: negative for cough or wheezing Urologic: negative for hematuria Abdominal: negative for nausea, vomiting, diarrhea,  bright red blood per rectum, melena, or hematemesis Neurologic: negative for visual changes, syncope, or dizziness All other systems reviewed and are otherwise negative except as noted above.    Blood pressure 136/85, pulse (!) 52, height 5' 11.5" (1.816 m), weight 194 lb 3.2 oz (88.1 kg), SpO2 98 %.  General appearance: alert and no distress Neck: no adenopathy, no carotid bruit, no JVD, supple, symmetrical, trachea midline and thyroid not enlarged, symmetric, no tenderness/mass/nodules Lungs: clear to auscultation bilaterally Heart: regular rate and rhythm, S1, S2 normal, no murmur, click, rub or gallop Extremities: extremities normal, atraumatic, no cyanosis or edema Pulses: 2+ and symmetric Skin: Skin color, texture, turgor normal. No rashes or lesions Neurologic: Alert and oriented X 3, normal strength and tone. Normal symmetric reflexes. Normal coordination and gait  EKG not performed today  ASSESSMENT AND PLAN:   CAD- DMI RCA DES 06/10/11- residual 40-50% CFX, 30-40% LAD History of CAD status post acute inferior wall myocardial infarction 06/10/2011 treated with PCI and stenting of his distal dominant RCA with a Promus element drug-eluting stent (2.5 mm x 16 mm long).  He did have a 90% stenosis in the proximal acute marginal branch of the RCA with an EF 50% and mild inferoapical hypokinesia.  His peak CPK was 926 with an MB of 48 and a troponin of 15.  He said no recurrent symptoms.  He did come in with recurrent chest pain 05/23/2015 and ruled out for myocardial infarction.  He underwent cardiac catheterization by Dr. Angelena Form revealing unchanged anatomy with a patent distal RCA stent and scattered disease otherwise with a total mid LAD which was old, left to left collaterals and normal LV function.  He has been asymptomatic since.  Hyperlipidemia History of hyperlipidemia on atorvastatin 40 mg a day with lipid profile performed 05/30/2017 revealing total cholesterol 141, LDL of 90 and  HDL 35.  He does admit to dietary indiscretion.  We will check a lipid and liver profile today.      Lorretta Harp MD FACP,FACC,FAHA, [facility] 07/09/2018 9:48 AM

## 2018-07-09 NOTE — Patient Instructions (Signed)
Medication Instructions:  Your physician recommends that you continue on your current medications as directed. Please refer to the Current Medication list given to you today.  If you need a refill on your cardiac medications before your next appointment, please call your pharmacy.   Lab work: Your physician recommends that you return for lab work today: [place] If you have labs (blood work) drawn today and your tests are completely normal, you will receive your results only by: Marland Kitchen MyChart Message (if you have MyChart) OR . A paper copy in the mail If you have any lab test that is abnormal or we need to change your treatment, we will call you to review the results.  Testing/Procedures: NONE  Follow-Up: At [facility], you and your health needs are our priority.  As part of our continuing mission to provide you with exceptional heart care, we have created designated Provider Care Teams.  These Care Teams include your primary Cardiologist (physician) and Advanced Practice Providers (APPs -  Physician Assistants and Nurse Practitioners) who all work together to provide you with the care you need, when you need it. . You will need a follow up appointment in 12 months.  Please call our office 2 months in advance to schedule this appointment.  You may see Dr. Gwenlyn Found or one of the following Advanced Practice Providers on your designated Care Team:   . Kerin Ransom, [place] . Almyra Deforest, PA-C . Fabian Sharp, PA-C . Jory Sims, DNP . Rosaria Ferries, PA-C . Roby Lofts, PA-C . Sande Rives, PA-C

## 2018-07-09 NOTE — Assessment & Plan Note (Signed)
History of hyperlipidemia on atorvastatin 40 mg a day with lipid profile performed 05/30/2017 revealing total cholesterol 141, LDL of 90 and HDL 35.  He does admit to dietary indiscretion.  We will check a lipid and liver profile today.

## 2018-07-10 ENCOUNTER — Telehealth: Payer: Self-pay | Admitting: Cardiovascular Disease

## 2018-07-10 NOTE — Telephone Encounter (Signed)
Patient returned call labs results.

## 2018-07-12 ENCOUNTER — Other Ambulatory Visit: Payer: Self-pay | Admitting: Cardiovascular Disease

## 2018-07-14 ENCOUNTER — Other Ambulatory Visit: Payer: Self-pay | Admitting: Cardiovascular Disease

## 2018-07-16 DIAGNOSIS — I2510 Atherosclerotic heart disease of native coronary artery without angina pectoris: Secondary | ICD-10-CM | POA: Diagnosis not present

## 2018-07-16 DIAGNOSIS — Z125 Encounter for screening for malignant neoplasm of prostate: Secondary | ICD-10-CM | POA: Diagnosis not present

## 2018-07-16 DIAGNOSIS — E7800 Pure hypercholesterolemia, unspecified: Secondary | ICD-10-CM | POA: Diagnosis not present

## 2018-07-16 DIAGNOSIS — Z0000 Encounter for general adult medical examination without abnormal findings: Secondary | ICD-10-CM | POA: Diagnosis not present

## 2018-07-16 DIAGNOSIS — Z23 Encounter for immunization: Secondary | ICD-10-CM | POA: Diagnosis not present

## 2018-07-18 ENCOUNTER — Other Ambulatory Visit: Payer: Self-pay

## 2018-07-18 MED ORDER — ATORVASTATIN CALCIUM 80 MG PO TABS: 40.0000 mg | ORAL_TABLET | Freq: Every day | ORAL | 3 refills | Status: DC

## 2018-07-18 MED ORDER — CLOPIDOGREL BISULFATE 75 MG PO TABS: 75.0000 mg | ORAL_TABLET | Freq: Every day | ORAL | 3 refills | Status: DC

## 2018-07-18 NOTE — Telephone Encounter (Signed)
Rx(s) sent to pharmacy electronically.  

## 2018-09-18 IMAGING — US US EXTREM LOW VENOUS*L*
1 series · 13 of 24 positions shown · non-contrast
Comparison: None.

CLINICAL DATA: Left lower extremity swelling and pain



[Series 1: us extrem low venous*left* · 0.07mm/px · 13 of 34 slices shown]
[im 1/34]
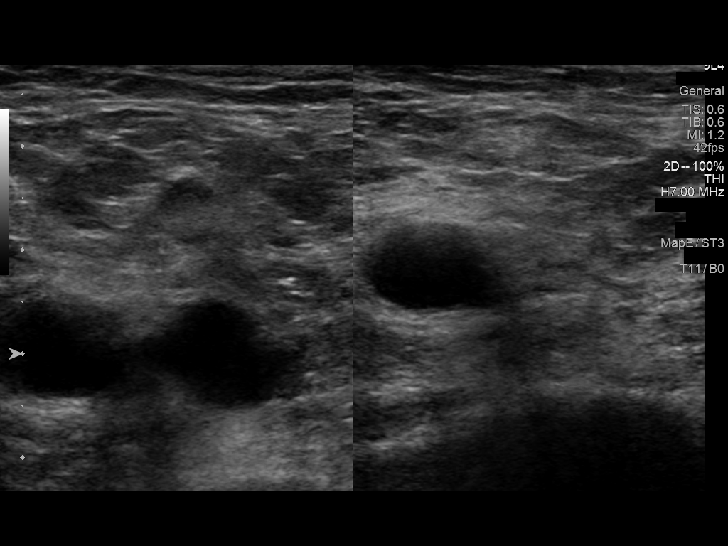
[im 3/34]
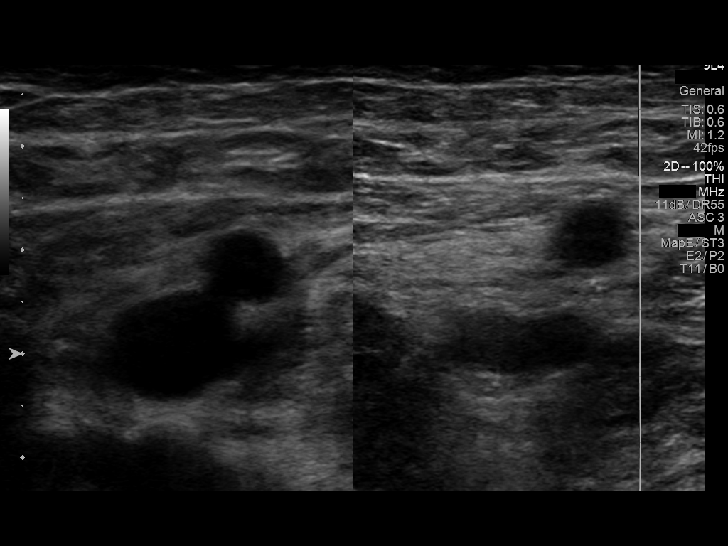
[im 6/34]
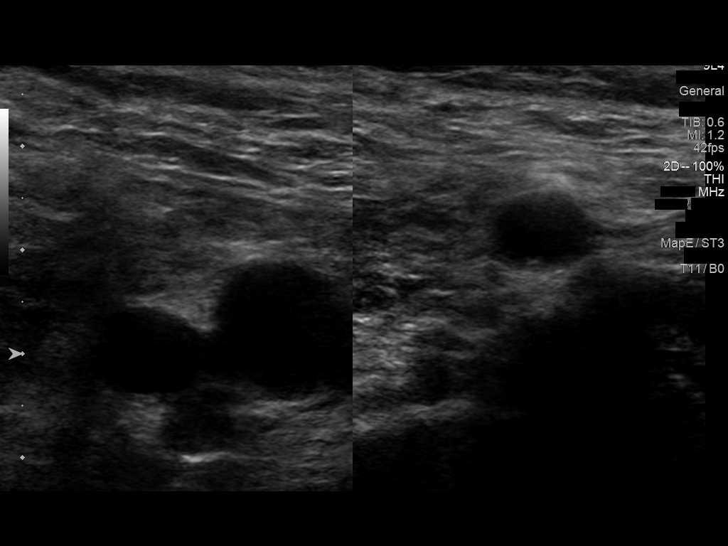
[im 9/34]
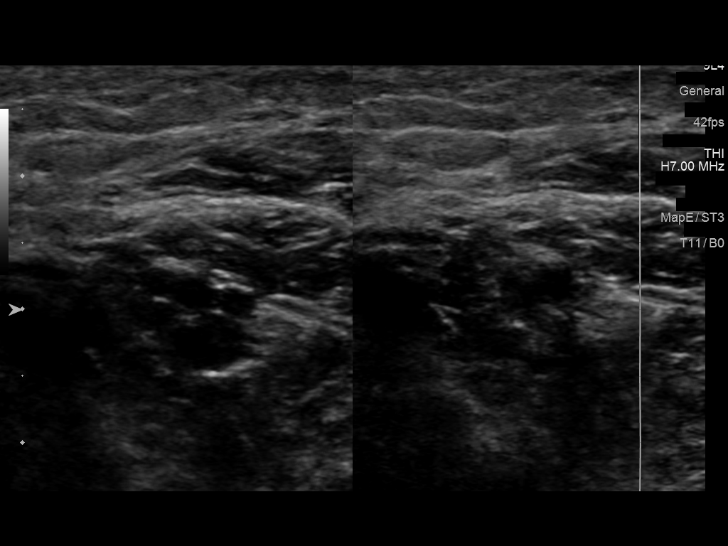
[im 12/34]
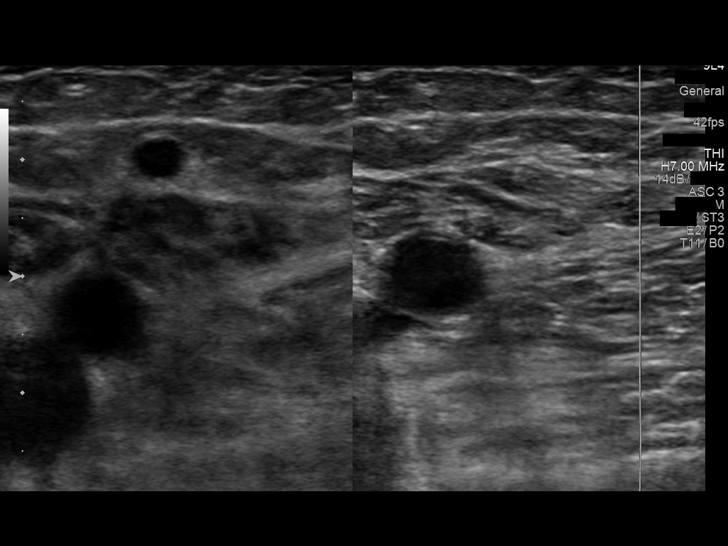
[im 15/34]
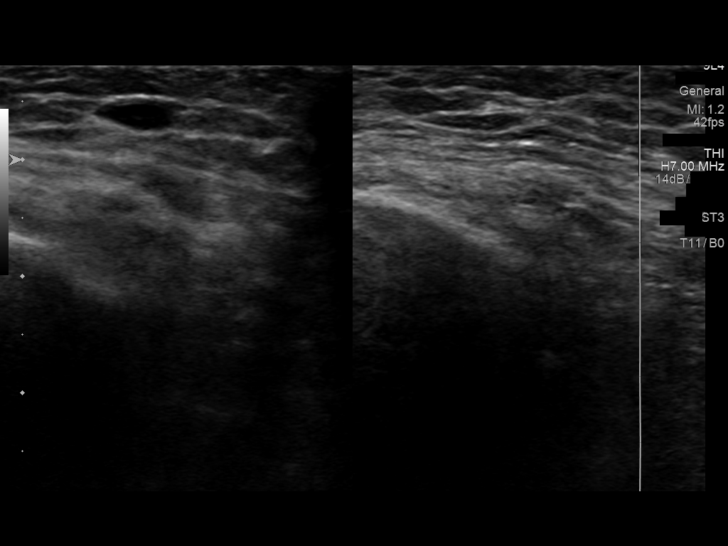
[im 18/34]
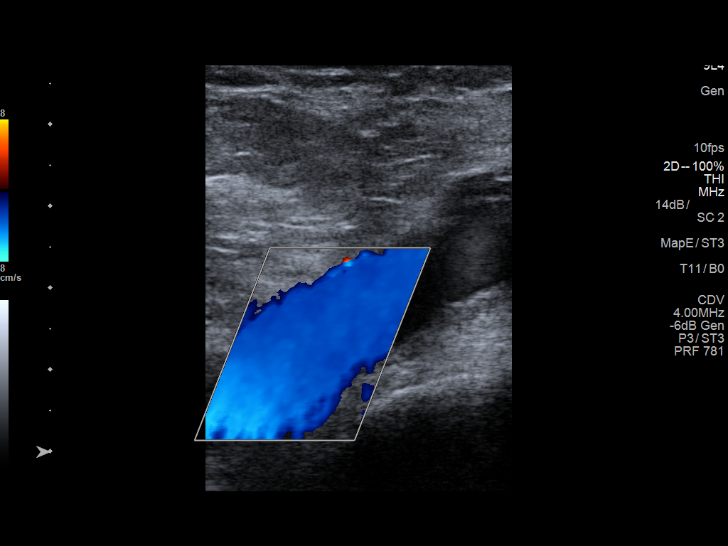
[im 19/34]
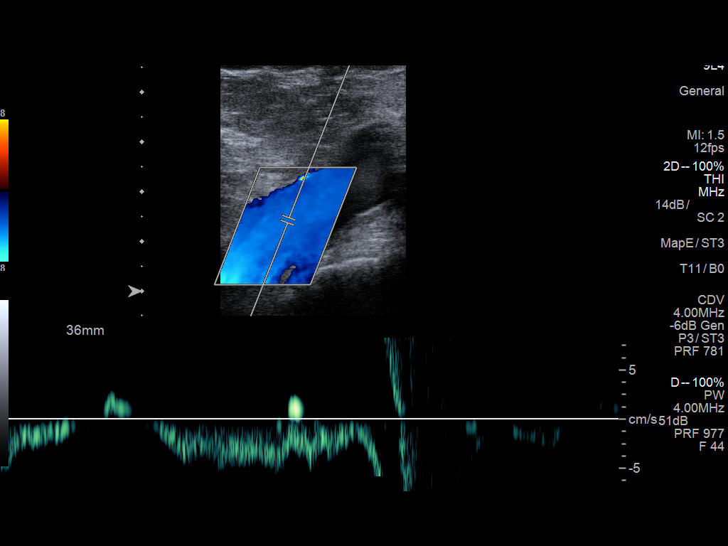
[im 22/34]
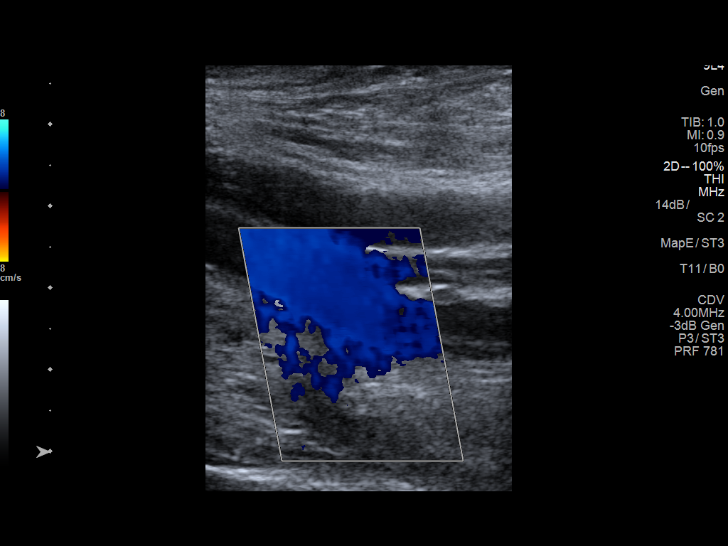
[im 25/34]
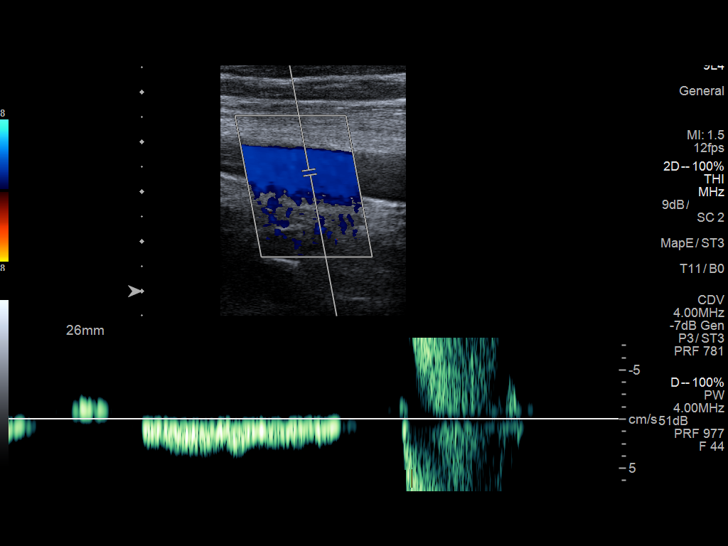
[im 28/34]
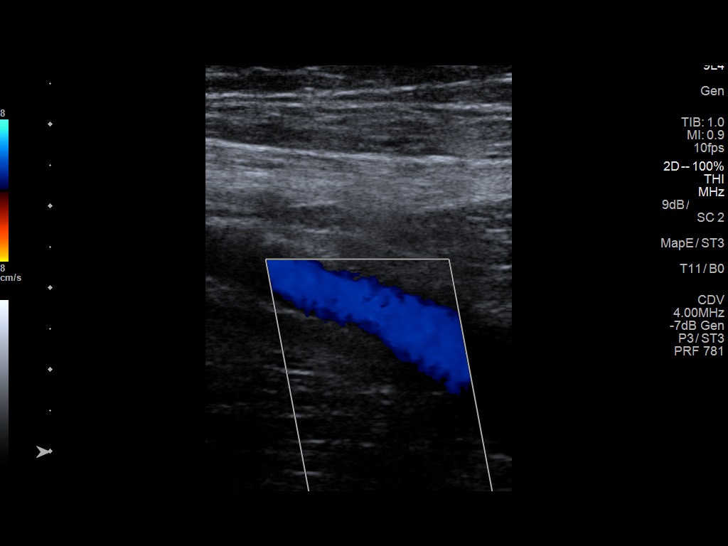
[im 31/34]
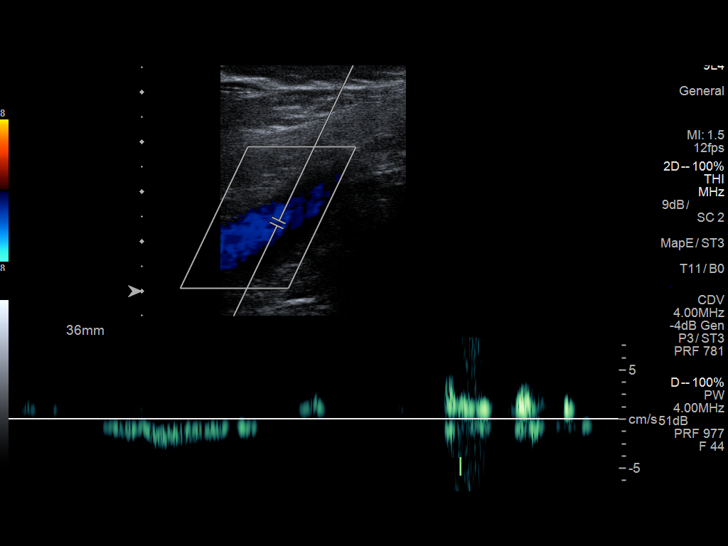
[im 34/34]
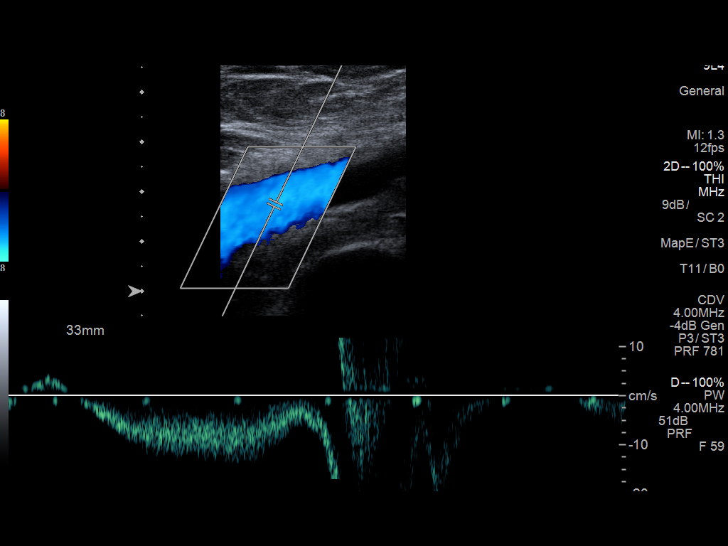

[13 of 24 positions shown; findings below may reference images not displayed]

FINDINGS: Contralateral Common Femoral Vein: Respiratory phasicity is normal
and symmetric with the symptomatic side. No evidence of thrombus.
Normal compressibility.

Common Femoral Vein: No evidence of thrombus. Normal
compressibility, respiratory phasicity and response to augmentation.

Saphenofemoral Junction: No evidence of thrombus. Normal
compressibility and flow on color Doppler imaging.

Profunda Femoral Vein: No evidence of thrombus. Normal
compressibility and flow on color Doppler imaging.

Femoral Vein: No evidence of thrombus. Normal compressibility,
respiratory phasicity and response to augmentation.

Popliteal Vein: No evidence of thrombus. Normal compressibility,
respiratory phasicity and response to augmentation.

Calf Veins: No evidence of thrombus. Normal compressibility and flow
on color Doppler imaging.

Superficial Great Saphenous Vein: No evidence of thrombus. Normal
compressibility and flow on color Doppler imaging.

Venous Reflux:  None.

Other Findings:  None.
IMPRESSION: No evidence of significant DVT within the left lower extremity.

## 2019-02-02 DIAGNOSIS — L219 Seborrheic dermatitis, unspecified: Secondary | ICD-10-CM | POA: Diagnosis not present

## 2019-02-02 DIAGNOSIS — Z87898 Personal history of other specified conditions: Secondary | ICD-10-CM | POA: Diagnosis not present

## 2019-02-02 DIAGNOSIS — L821 Other seborrheic keratosis: Secondary | ICD-10-CM | POA: Diagnosis not present

## 2019-02-02 DIAGNOSIS — Z23 Encounter for immunization: Secondary | ICD-10-CM | POA: Diagnosis not present

## 2019-02-02 DIAGNOSIS — D225 Melanocytic nevi of trunk: Secondary | ICD-10-CM | POA: Diagnosis not present

## 2019-02-02 DIAGNOSIS — L814 Other melanin hyperpigmentation: Secondary | ICD-10-CM | POA: Diagnosis not present

## 2019-02-02 DIAGNOSIS — L570 Actinic keratosis: Secondary | ICD-10-CM | POA: Diagnosis not present

## 2019-07-06 ENCOUNTER — Ambulatory Visit: Payer: PPO | Attending: Internal Medicine

## 2019-07-06 DIAGNOSIS — Z23 Encounter for immunization: Secondary | ICD-10-CM

## 2019-07-06 NOTE — Progress Notes (Signed)
   Covid-19 Vaccination Clinic  Name:  Dylan Johnson    MRN: [id number] DOB: [birth year]  07/06/2019  Dylan Johnson was observed post Covid-19 immunization for 15 minutes without incidence. He was provided with Vaccine Information Sheet and instruction to access the V-Safe system.   Dylan Johnson was instructed to call 911 with any severe reactions post vaccine: Marland Kitchen Difficulty breathing  . Swelling of your face and throat  . A fast heartbeat  . A bad rash all over your body  . Dizziness and weakness    Immunizations Administered    Name Date Dose VIS Date Route   Pfizer COVID-19 Vaccine 07/06/2019  9:13 AM 0.3 mL 04/24/2019 Intramuscular   Manufacturer: [place]   Lot: [id number]   [place]: [id number]

## 2019-07-18 ENCOUNTER — Other Ambulatory Visit: Payer: Self-pay | Admitting: Cardiovascular Disease

## 2019-07-21 ENCOUNTER — Other Ambulatory Visit: Payer: Self-pay

## 2019-07-21 ENCOUNTER — Ambulatory Visit (HOSPITAL_COMMUNITY)
Admission: RE | Admit: 2019-07-21 | Discharge: 2019-07-21 | Disposition: A | Discharge status: Home / Self Care | Payer: PPO | Source: Ambulatory Visit | Attending: Internal Medicine | Admitting: Internal Medicine

## 2019-07-21 ENCOUNTER — Telehealth: Payer: Self-pay

## 2019-07-21 DIAGNOSIS — M7989 Other specified soft tissue disorders: Secondary | ICD-10-CM

## 2019-07-21 NOTE — Telephone Encounter (Signed)
Pt called about L leg pain - stating that he is having Swelling and pain that started last Thursday. Spoke to Dr. Gwenlyn Found - he wants to get venous dopplers prior to f/u visit. Pt has not been seen in 1 yr. Will message scheduling about doppler and f/u appt.

## 2019-07-28 ENCOUNTER — Other Ambulatory Visit: Payer: Self-pay

## 2019-07-28 ENCOUNTER — Ambulatory Visit: Payer: PPO | Attending: Internal Medicine

## 2019-07-28 ENCOUNTER — Encounter: Payer: Self-pay | Admitting: Cardiovascular Disease

## 2019-07-28 ENCOUNTER — Ambulatory Visit: Payer: PPO | Admitting: Cardiovascular Disease

## 2019-07-28 VITALS — BP 132/80 | HR 62 | Ht 72.0 in | Wt 189.2 lb

## 2019-07-28 DIAGNOSIS — I2510 Atherosclerotic heart disease of native coronary artery without angina pectoris: Secondary | ICD-10-CM

## 2019-07-28 DIAGNOSIS — E782 Mixed hyperlipidemia: Secondary | ICD-10-CM

## 2019-07-28 DIAGNOSIS — Z23 Encounter for immunization: Secondary | ICD-10-CM

## 2019-07-28 NOTE — Assessment & Plan Note (Signed)
History of CAD status post inferior STEMI 06/10/2011 for LAD PCI and stenting of the distal dominant RCA with a Promus element drug-eluting stent (2.5 mm x 16 mm long).  He did have a 90% stenosis in the proximal acute marginal branch of the RCA with an EF of 50% at that time and inferoapical hypokinesia.  He underwent recatheterization by Dr. Angelena Form 05/23/2015 revealing a widely patent stent with unchanged anatomy.  He currently denies chest pain or shortness of breath.

## 2019-07-28 NOTE — Progress Notes (Signed)
   Covid-19 Vaccination Clinic  Name:  Dylan Johnson    MRN: [id number] DOB: 12-Jul-1950  07/28/2019  [name] was observed post Covid-19 immunization for 15 minutes without incident. He was provided with Vaccine Information Sheet and instruction to access the V-Safe system.   [name] was instructed to call 911 with any severe reactions post vaccine: Marland Kitchen Difficulty breathing  . Swelling of face and throat  . A fast heartbeat  . A bad rash all over body  . Dizziness and weakness   Immunizations Administered    Name Date Dose VIS Date Route   Pfizer COVID-19 Vaccine 07/28/2019  8:51 AM 0.3 mL 04/24/2019 Intramuscular   Manufacturer: [place]   Lot: [id number]   [place]: [phone]

## 2019-07-28 NOTE — Patient Instructions (Signed)
Medication Instructions:  NO CHANGE *If you need a refill on your cardiac medications before your next appointment, please call your pharmacy*   Lab Work: If you have labs (blood work) drawn today and your tests are completely normal, you will receive your results only by: . MyChart Message (if you have MyChart) OR . A paper copy in the mail If you have any lab test that is abnormal or we need to change your treatment, we will call you to review the results.   Follow-Up: At [facility], you and your health needs are our priority.  As part of our continuing mission to provide you with exceptional heart care, we have created designated Provider Care Teams.  These Care Teams include your primary Cardiologist (physician) and Advanced Practice Providers (APPs -  Physician Assistants and Nurse Practitioners) who all work together to provide you with the care you need, when you need it.  We recommend signing up for the patient portal called "MyChart".  Sign up information is provided on this After Visit Summary.  MyChart is used to connect with patients for Virtual Visits (Telemedicine).  Patients are able to view lab/test results, encounter notes, upcoming appointments, etc.  Non-urgent messages can be sent to your provider as well.   To learn more about what you can do with MyChart, go to [email or web address].com.    Your next appointment:   12 month(s)  The format for your next appointment:   In Person  Provider:   You may see Jonathan Berry, MD or one of the following Advanced Practice Providers on your designated Care Team:    Luke Kilroy, PA-C  Callie Goodrich, PA-C  Jesse Cleaver, FNP     

## 2019-07-28 NOTE — Assessment & Plan Note (Signed)
History of hyperlipidemia on statin therapy with lipid profile performed 06/19/2018 revealing total pressure 137, LDL of 85 and HDL 37 followed by his PCP.

## 2019-07-28 NOTE — Progress Notes (Signed)
07/28/2019 GARVICE GURNEE   Nov 08, 1950  [id number]  Primary Physician Alroy Dust, L.Marlou Sa, MD Primary Cardiologist: Lorretta Harp MD Lupe Carney, [place]  HPI:  Dylan Johnson is a 69 y.o.   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 I last saw him  07/09/2018. He is status post acute inferior-wall myocardial infarction1/27/13treated with PCI and stenting of his distal dominant RCA with a Promus Element drug-eluting stent (2.5 x 16 mm long). He did have 90% stenosis in the proximal acute marginal branch of the RCA with an EF of 50% and mild inferoapical hypokinesia. His peak CPK was 926 with an MB of 48 and a troponin of 15. He has had no recurrent symptoms. Echo performed August 23, 2011, was entirely normal and a Myoview showed apical and septal scar without ischemia. He was admitted to [facility] on 05/23/15 with chest pain. He ruled out for myocardial infarction. He underwent cardiac catheterization by Dr. Prince Rome revealing unchanged anatomy with a patent distal RCA stent, scattered disease otherwise with a total mid LAD which was old, left left collaterals and normal LV function. His echo was normal. His d-dimer was negative and he was discharged home the same day.  Since I saw him a year ago he has been asymptomatic specifically denying chest pain or shortness of breath .  He did have some right calf pain recently underwent venous Doppler study that ruled out DVT.   Current Meds  Medication Sig  . aspirin EC 81 MG tablet Take 81 mg by mouth daily.  Marland Kitchen atorvastatin (LIPITOR) 80 MG tablet TAKE 1/2 TABLET(40 MG) BY MOUTH DAILY AT 6 PM  . clopidogrel (PLAVIX) 75 MG tablet Take 75 mg by mouth daily. Pt takes half tablet in the morning and half table at night  . metoprolol tartrate (LOPRESSOR) 25 MG tablet TAKE 1/2 TABLET(12.5 MG) BY MOUTH TWICE DAILY  . nitroGLYCERIN (NITROSTAT) 0.4 MG SL tablet Place 1 tablet (0.4 mg  total) under the tongue every 5 (five) minutes as needed for chest pain.     No Known Allergies  Social History   Socioeconomic History  . Marital status: Married    Spouse name: Not on file  . Number of children: Not on file  . Years of education: Not on file  . Highest education level: Not on file  Occupational History  . Not on file  Tobacco Use  . Smoking status: Never Smoker  . Smokeless tobacco: Never Used  Substance and Sexual Activity  . Alcohol use: No  . Drug use: No  . Sexual activity: Yes  Other Topics Concern  . Not on file  Social History Narrative  . Not on file   Social Determinants of Health   Financial Resource Strain:   . Difficulty of Paying Living Expenses:   Food Insecurity:   . Worried About Charity fundraiser in the Last Year:   . Arboriculturist in the Last Year:   Transportation Needs:   . Film/video editor (Medical):   Marland Kitchen Lack of Transportation (Non-Medical):   Physical Activity:   . Days of Exercise per Week:   . Minutes of Exercise per Session:   Stress:   . Feeling of Stress :   Social Connections:   . Frequency of Communication with Friends and Family:   . Frequency of Social Gatherings with Friends and Family:   . Attends Religious Services:   .  Active Member of Clubs or Organizations:   . Attends Archivist Meetings:   Marland Kitchen Marital Status:   Intimate Partner Violence:   . Fear of Current or Ex-Partner:   . Emotionally Abused:   Marland Kitchen Physically Abused:   . Sexually Abused:      Review of Systems: General: negative for chills, fever, night sweats or weight changes.  Cardiovascular: negative for chest pain, dyspnea on exertion, edema, orthopnea, palpitations, paroxysmal nocturnal dyspnea or shortness of breath Dermatological: negative for rash Respiratory: negative for cough or wheezing Urologic: negative for hematuria Abdominal: negative for nausea, vomiting, diarrhea, bright red blood per rectum, melena, or  hematemesis Neurologic: negative for visual changes, syncope, or dizziness All other systems reviewed and are otherwise negative except as noted above.    Blood pressure 132/80, pulse 62, height 6' (1.829 m), weight 189 lb 3.2 oz (85.8 kg).  General appearance: alert and no distress Neck: no adenopathy, no carotid bruit, no JVD, supple, symmetrical, trachea midline and thyroid not enlarged, symmetric, no tenderness/mass/nodules Lungs: clear to auscultation bilaterally Heart: regular rate and rhythm, S1, S2 normal, no murmur, click, rub or gallop Extremities: extremities normal, atraumatic, no cyanosis or edema Pulses: 2+ and symmetric Skin: Skin color, texture, turgor normal. No rashes or lesions Neurologic: Alert and oriented X 3, normal strength and tone. Normal symmetric reflexes. Normal coordination and gait  EKG sinus rhythm at 62 without ST or T wave changes.  Personally reviewed this EKG.  ASSESSMENT AND PLAN:   CAD- DMI RCA DES 06/10/11- residual 40-50% CFX, 30-40% LAD History of CAD status post inferior STEMI 06/10/2011 for LAD PCI and stenting of the distal dominant RCA with a Promus element drug-eluting stent (2.5 mm x 16 mm long).  He did have a 90% stenosis in the proximal acute marginal branch of the RCA with an EF of 50% at that time and inferoapical hypokinesia.  He underwent recatheterization by Dr. Angelena Form 05/23/2015 revealing a widely patent stent with unchanged anatomy.  He currently denies chest pain or shortness of breath.  Hyperlipidemia History of hyperlipidemia on statin therapy with lipid profile performed 06/19/2018 revealing total pressure 137, LDL of 85 and HDL 37 followed by his PCP.      Lorretta Harp MD FACP,FACC,FAHA, [facility] 07/28/2019 1:54 PM

## 2019-08-04 DIAGNOSIS — L821 Other seborrheic keratosis: Secondary | ICD-10-CM | POA: Diagnosis not present

## 2019-08-04 DIAGNOSIS — Z87898 Personal history of other specified conditions: Secondary | ICD-10-CM | POA: Diagnosis not present

## 2019-08-04 DIAGNOSIS — L578 Other skin changes due to chronic exposure to nonionizing radiation: Secondary | ICD-10-CM | POA: Diagnosis not present

## 2019-08-04 DIAGNOSIS — L570 Actinic keratosis: Secondary | ICD-10-CM | POA: Diagnosis not present

## 2019-08-04 DIAGNOSIS — Z23 Encounter for immunization: Secondary | ICD-10-CM | POA: Diagnosis not present

## 2019-08-04 DIAGNOSIS — L814 Other melanin hyperpigmentation: Secondary | ICD-10-CM | POA: Diagnosis not present

## 2019-08-04 DIAGNOSIS — D225 Melanocytic nevi of trunk: Secondary | ICD-10-CM | POA: Diagnosis not present

## 2019-08-05 DIAGNOSIS — E7800 Pure hypercholesterolemia, unspecified: Secondary | ICD-10-CM | POA: Diagnosis not present

## 2019-08-05 DIAGNOSIS — Z0000 Encounter for general adult medical examination without abnormal findings: Secondary | ICD-10-CM | POA: Diagnosis not present

## 2019-08-05 DIAGNOSIS — Z125 Encounter for screening for malignant neoplasm of prostate: Secondary | ICD-10-CM | POA: Diagnosis not present

## 2019-08-05 DIAGNOSIS — I2510 Atherosclerotic heart disease of native coronary artery without angina pectoris: Secondary | ICD-10-CM | POA: Diagnosis not present

## 2019-08-05 DIAGNOSIS — Z1211 Encounter for screening for malignant neoplasm of colon: Secondary | ICD-10-CM | POA: Diagnosis not present

## 2019-10-15 DIAGNOSIS — Z800 Family history of malignant neoplasm of digestive organs: Secondary | ICD-10-CM | POA: Diagnosis not present

## 2019-10-15 DIAGNOSIS — Z8679 Personal history of other diseases of the circulatory system: Secondary | ICD-10-CM | POA: Diagnosis not present

## 2019-10-23 ENCOUNTER — Telehealth: Payer: Self-pay

## 2019-10-23 NOTE — Telephone Encounter (Signed)
   [facility] Pre-operative Risk Assessment    HEARTCARE STAFF: - Please ensure there is not already an duplicate clearance open for this procedure. - Under Visit Info/Reason for Call, type in Other and utilize the format Clearance MM/DD/YY or Clearance TBD. Do not use dashes or single digits. - If request is for dental extraction, please clarify the # of teeth to be extracted.  Request for surgical clearance:  1. What type of surgery is being performed? COLONOSCOPY    2. When is this surgery scheduled? TBD-July?   3. What type of clearance is required (medical clearance vs. Pharmacy clearance to hold med vs. Both)? BOTH  4. Are there any medications that need to be held prior to surgery and how long? PLAVIX   5. Practice name and name of physician performing surgery? [facility]   6. What is the office phone number? [phone]   7.   What is the office fax number? [phone]  8.   Anesthesia type (None, local, MAC, general) ? PROPOFOL

## 2019-10-24 NOTE — Telephone Encounter (Signed)
OK to hold plavix for colonoscopy 

## 2019-10-26 NOTE — Telephone Encounter (Signed)
   Primary Cardiologist: Quay Burow, MD  Chart reviewed as part of pre-operative protocol coverage. Given past medical history and time since last visit, based on ACC/AHA guidelines, MATHEAU ORONA would be at acceptable risk for the planned procedure without further cardiovascular testing.   He may hold his Plavix for 5 days prior to his procedure.  Please resume as soon as hemostasis is achieved.  I will route this recommendation to the requesting party via Epic fax function and remove from pre-op pool.  Please call with questions.  Jossie Ng. Vernia Teem NP-C    10/26/2019, 8:06 AM [place] [address] Office [phone] Fax [phone]

## 2019-11-26 NOTE — Telephone Encounter (Signed)
Forwarded to requesting provider via EPIC fax function

## 2019-11-26 NOTE — Telephone Encounter (Signed)
   Fax re-submitted.  Callback: - Please ensure fax was received. If not, please send manually. Thank you!  Abigail Butts, PA-C 11/26/19; 10:34 AM

## 2019-11-26 NOTE — Telephone Encounter (Signed)
Called [name] verified with Sherri she did receive the clearance fax

## 2019-11-26 NOTE — Telephone Encounter (Signed)
Follow up   Dylan Johnson states that they did not receive the fax back for the preop. Please resend to the fax # on file. I have verified that the fax # is correct.

## 2020-02-02 DIAGNOSIS — Z1159 Encounter for screening for other viral diseases: Secondary | ICD-10-CM | POA: Diagnosis not present

## 2020-02-05 DIAGNOSIS — K5730 Diverticulosis of large intestine without perforation or abscess without bleeding: Secondary | ICD-10-CM | POA: Diagnosis not present

## 2020-02-05 DIAGNOSIS — Z1211 Encounter for screening for malignant neoplasm of colon: Secondary | ICD-10-CM | POA: Diagnosis not present

## 2020-02-05 DIAGNOSIS — Z800 Family history of malignant neoplasm of digestive organs: Secondary | ICD-10-CM | POA: Diagnosis not present

## 2020-02-05 DIAGNOSIS — K648 Other hemorrhoids: Secondary | ICD-10-CM | POA: Diagnosis not present

## 2020-02-23 DIAGNOSIS — Z87898 Personal history of other specified conditions: Secondary | ICD-10-CM | POA: Diagnosis not present

## 2020-02-23 DIAGNOSIS — L821 Other seborrheic keratosis: Secondary | ICD-10-CM | POA: Diagnosis not present

## 2020-02-23 DIAGNOSIS — L570 Actinic keratosis: Secondary | ICD-10-CM | POA: Diagnosis not present

## 2020-02-23 DIAGNOSIS — L814 Other melanin hyperpigmentation: Secondary | ICD-10-CM | POA: Diagnosis not present

## 2020-02-23 DIAGNOSIS — D225 Melanocytic nevi of trunk: Secondary | ICD-10-CM | POA: Diagnosis not present

## 2020-02-23 DIAGNOSIS — L578 Other skin changes due to chronic exposure to nonionizing radiation: Secondary | ICD-10-CM | POA: Diagnosis not present

## 2020-03-13 DIAGNOSIS — L237 Allergic contact dermatitis due to plants, except food: Secondary | ICD-10-CM | POA: Diagnosis not present

## 2020-07-12 ENCOUNTER — Other Ambulatory Visit: Payer: Self-pay | Admitting: Cardiovascular Disease

## 2020-07-27 ENCOUNTER — Other Ambulatory Visit: Payer: Self-pay

## 2020-07-27 ENCOUNTER — Encounter: Payer: Self-pay | Admitting: Cardiovascular Disease

## 2020-07-27 ENCOUNTER — Ambulatory Visit (INDEPENDENT_AMBULATORY_CARE_PROVIDER_SITE_OTHER): Payer: PPO | Admitting: Cardiovascular Disease

## 2020-07-27 VITALS — BP 132/80 | HR 55 | Ht 72.0 in | Wt 193.0 lb

## 2020-07-27 DIAGNOSIS — E782 Mixed hyperlipidemia: Secondary | ICD-10-CM | POA: Diagnosis not present

## 2020-07-27 DIAGNOSIS — I2510 Atherosclerotic heart disease of native coronary artery without angina pectoris: Secondary | ICD-10-CM

## 2020-07-27 MED ORDER — NITROGLYCERIN 0.4 MG SL SUBL: 0.4000 mg | SUBLINGUAL_TABLET | SUBLINGUAL | 6 refills | Status: DC | PRN

## 2020-07-27 NOTE — Progress Notes (Addendum)
08/19/2020 Dylan Johnson   February 11, 1951  [id number]  Primary Physician Alroy Dust, L.Marlou Sa, MD Primary Cardiologist: Lorretta Harp MD Lupe Carney, [place]  HPI:  Dylan Johnson is a 70 y.o.  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 He is status post acute inferior-wall myocardial infarction1/27/13treated with PCI and stenting of his distal dominant RCA with a Promus Element drug-eluting stent (2.5 x 16 mm long). He did have 90% stenosis in the proximal acute marginal branch of the RCA with an EF of 50% and mild inferoapical hypokinesia. His peak CPK was 926 with an MB of 48 and a troponin of 15. He has had no recurrent symptoms. Echo performed August 23, 2011, was entirely normal and a Myoview showed apical and septal scar without ischemia. He was admitted to [facility] on 05/23/15 with chest pain. He ruled out for myocardial infarction. He underwent cardiac catheterization by Dr. Prince Rome revealing unchanged anatomy with a patent distal RCA stent, scattered disease otherwise with a total mid LAD which was old, left left collaterals and normal LV function. His echo was normal. His d-dimer was negative and he was discharged home the same day.  Since I saw him a year ago he has been asymptomatic specifically denying chest pain or shortness of breath .    Current Meds  Medication Sig  . aspirin EC 81 MG tablet Take 81 mg by mouth daily.  Marland Kitchen atorvastatin (LIPITOR) 80 MG tablet TAKE 1/2 TABLET(40 MG) BY MOUTH DAILY AT 6 PM  . clopidogrel (PLAVIX) 75 MG tablet TAKE 1 TABLET(75 MG) BY MOUTH DAILY  . metoprolol tartrate (LOPRESSOR) 25 MG tablet TAKE 1/2 TABLET(12.5 MG) BY MOUTH TWICE DAILY  . [DISCONTINUED] nitroGLYCERIN (NITROSTAT) 0.4 MG SL tablet Place 1 tablet (0.4 mg total) under the tongue every 5 (five) minutes as needed for chest pain.     No Known Allergies  Social History    Socioeconomic History  . Marital status: Married    Spouse name: Not on file  . Number of children: Not on file  . Years of education: Not on file  . Highest education level: Not on file  Occupational History  . Not on file  Tobacco Use  . Smoking status: Never Smoker  . Smokeless tobacco: Never Used  Substance and Sexual Activity  . Alcohol use: No  . Drug use: No  . Sexual activity: Yes  Other Topics Concern  . Not on file  Social History Narrative  . Not on file   Social Determinants of Health   Financial Resource Strain: Not on file  Food Insecurity: Not on file  Transportation Needs: Not on file  Physical Activity: Not on file  Stress: Not on file  Social Connections: Not on file  Intimate Partner Violence: Not on file     Review of Systems: General: negative for chills, fever, night sweats or weight changes.  Cardiovascular: negative for chest pain, dyspnea on exertion, edema, orthopnea, palpitations, paroxysmal nocturnal dyspnea or shortness of breath Dermatological: negative for rash Respiratory: negative for cough or wheezing Urologic: negative for hematuria Abdominal: negative for nausea, vomiting, diarrhea, bright red blood per rectum, melena, or hematemesis Neurologic: negative for visual changes, syncope, or dizziness All other systems reviewed and are otherwise negative except as noted above.    Blood pressure 132/80, pulse (!) 55, height 6' (1.829 m), weight 193 lb (87.5 kg), SpO2 99 %.  General appearance: alert and no distress Neck: no adenopathy, no carotid bruit, no JVD, supple, symmetrical, trachea midline and thyroid not enlarged, symmetric, no tenderness/mass/nodules Lungs: clear to auscultation bilaterally Heart: regular rate and rhythm, S1, S2 normal, no murmur, click, rub or gallop Extremities: extremities normal, atraumatic, no cyanosis or edema Pulses: 2+ and symmetric Skin: Skin color, texture, turgor normal. No rashes or  lesions Neurologic: Alert and oriented X 3, normal strength and tone. Normal symmetric reflexes. Normal coordination and gait  EKG sinus bradycardia 55 with borderline voltage for LVH and early R wave transition.  I personally reviewed this EKG.  ASSESSMENT AND PLAN:   CAD- DMI RCA DES 06/10/11- residual 40-50% CFX, 30-40% LAD History of CAD status post inferior STEMI 06/10/2011 treated with PCI and stenting of the distal dominant RCA with a Promus element drug-eluting stent (2.5 mm x 16 mm once).  He did have a 90% stenosis in the proximal acute marginal branch of the RCA and EF of 50% with mild inferoapical hypokinesia.  His peak CPK was 926 with an MB of 48 and troponin of 15 at that time.  He underwent repeat cardiac catheterization by Dr. Angelena Form 05/23/2015 revealing a patent stent with otherwise no significant CAD.  Hyperlipidemia History of hyperlipidemia on statin therapy with lipid profile performed 08/05/2019 revealing total cholesterol 133, LDL of 83 and HDL of 38 followed by his PCP.  His most recent lab work performed by his PCP 08/12/2020 revealed total cholesterol 133, LDL of 84 and HDL of 32.      Lorretta Harp MD [facility], [facility] 08/19/2020 4:31 PM

## 2020-07-27 NOTE — Assessment & Plan Note (Addendum)
History of hyperlipidemia on statin therapy with lipid profile performed 08/05/2019 revealing total cholesterol 133, LDL of 83 and HDL of 38 followed by his PCP.  His most recent lab work performed by his PCP 08/12/2020 revealed total cholesterol 133, LDL of 84 and HDL of 32.

## 2020-07-27 NOTE — Assessment & Plan Note (Signed)
History of CAD status post inferior STEMI 06/10/2011 treated with PCI and stenting of the distal dominant RCA with a Promus element drug-eluting stent (2.5 mm x 16 mm once).  He did have a 90% stenosis in the proximal acute marginal branch of the RCA and EF of 50% with mild inferoapical hypokinesia.  His peak CPK was 926 with an MB of 48 and troponin of 15 at that time.  He underwent repeat cardiac catheterization by Dr. Angelena Form 05/23/2015 revealing a patent stent with otherwise no significant CAD.

## 2020-07-27 NOTE — Patient Instructions (Signed)

## 2020-08-12 DIAGNOSIS — I2510 Atherosclerotic heart disease of native coronary artery without angina pectoris: Secondary | ICD-10-CM | POA: Diagnosis not present

## 2020-08-12 DIAGNOSIS — Z23 Encounter for immunization: Secondary | ICD-10-CM | POA: Diagnosis not present

## 2020-08-12 DIAGNOSIS — E7800 Pure hypercholesterolemia, unspecified: Secondary | ICD-10-CM | POA: Diagnosis not present

## 2020-08-12 DIAGNOSIS — Z0000 Encounter for general adult medical examination without abnormal findings: Secondary | ICD-10-CM | POA: Diagnosis not present

## 2020-08-12 DIAGNOSIS — Z125 Encounter for screening for malignant neoplasm of prostate: Secondary | ICD-10-CM | POA: Diagnosis not present

## 2020-08-17 DIAGNOSIS — Z87898 Personal history of other specified conditions: Secondary | ICD-10-CM | POA: Diagnosis not present

## 2020-08-17 DIAGNOSIS — L570 Actinic keratosis: Secondary | ICD-10-CM | POA: Diagnosis not present

## 2020-08-17 DIAGNOSIS — L578 Other skin changes due to chronic exposure to nonionizing radiation: Secondary | ICD-10-CM | POA: Diagnosis not present

## 2020-08-17 DIAGNOSIS — L814 Other melanin hyperpigmentation: Secondary | ICD-10-CM | POA: Diagnosis not present

## 2020-08-17 DIAGNOSIS — L821 Other seborrheic keratosis: Secondary | ICD-10-CM | POA: Diagnosis not present

## 2020-08-17 DIAGNOSIS — D225 Melanocytic nevi of trunk: Secondary | ICD-10-CM | POA: Diagnosis not present

## 2020-10-10 ENCOUNTER — Other Ambulatory Visit: Payer: Self-pay | Admitting: Cardiovascular Disease

## 2020-11-01 ENCOUNTER — Telehealth: Payer: Self-pay

## 2020-11-01 NOTE — Telephone Encounter (Signed)
Left message for pt to call back regarding lab results from [facility].

## 2020-11-01 NOTE — Telephone Encounter (Signed)
-----   Message from Lorretta Harp, MD sent at 09/12/2020  6:34 PM EDT ----- LDL 84 on Atorva 80. Not at goal for secondary prevention. Add zetia 10 mg and recheck 3 months. Goal LDL<70

## 2020-11-01 NOTE — Telephone Encounter (Signed)
Pt is returning call.  

## 2020-11-01 NOTE — Telephone Encounter (Signed)
Called patient, advised of message below for blood work results.   Patient states he is not on the 80 mg Atorvastatin, he is only taking 0.5 tablets (40 mg) he would like to know if he should increase this, or if he should still try the Zetia.   I advised I would route to MD to advise.  Thanks!

## 2020-11-04 ENCOUNTER — Other Ambulatory Visit: Payer: Self-pay

## 2020-11-04 DIAGNOSIS — E782 Mixed hyperlipidemia: Secondary | ICD-10-CM

## 2020-11-04 MED ORDER — ATORVASTATIN CALCIUM 80 MG PO TABS: 80.0000 mg | ORAL_TABLET | Freq: Every day | ORAL | 3 refills | Status: DC

## 2020-11-04 NOTE — Telephone Encounter (Signed)
Called patient, advised of message from MD.  Ordered blood work, and updated med list.  Patient verbalized understanding.

## 2021-01-02 ENCOUNTER — Telehealth: Payer: Self-pay | Admitting: Cardiovascular Disease

## 2021-01-02 MED ORDER — ATORVASTATIN CALCIUM 80 MG PO TABS: 80.0000 mg | ORAL_TABLET | Freq: Every day | ORAL | 3 refills | Status: DC

## 2021-01-02 NOTE — Telephone Encounter (Signed)
90 day supply with refills of Atorvastatin sent to [facility] on [address]

## 2021-01-02 NOTE — Telephone Encounter (Signed)
*  STAT* If patient is at the pharmacy, call can be transferred to refill team.   1. Which medications need to be refilled? (please list name of each medication and dose if known) new prescription for his Atorvastatin *  2. Which pharmacy/location (including street and city if local pharmacy) is medication to be sent to? [facility], [address] [place]  3. Do they need a 30 day or 90 day supply? 90 days and refills

## 2021-01-06 ENCOUNTER — Telehealth: Payer: Self-pay | Admitting: Cardiovascular Disease

## 2021-01-06 MED ORDER — ATORVASTATIN CALCIUM 80 MG PO TABS: 80.0000 mg | ORAL_TABLET | Freq: Every day | ORAL | 3 refills | Status: DC

## 2021-01-06 NOTE — Telephone Encounter (Signed)
Left a message for the pt that his RX for Lipitor has been sent to his pharmacy.

## 2021-01-06 NOTE — Telephone Encounter (Signed)
Pt c/o medication issue:  1. Name of Medication:   atorvastatin (LIPITOR) 80 MG tablet    2. How are you currently taking this medication (dosage and times per day)? As prescribed  3. Are you having a reaction (difficulty breathing--STAT)? No  4. What is your medication issue? pt is wanting a updated prescription sent to the pharmacy

## 2021-04-15 DIAGNOSIS — L03115 Cellulitis of right lower limb: Secondary | ICD-10-CM | POA: Diagnosis not present

## 2021-05-11 DIAGNOSIS — J0100 Acute maxillary sinusitis, unspecified: Secondary | ICD-10-CM | POA: Diagnosis not present

## 2021-05-23 DIAGNOSIS — R0982 Postnasal drip: Secondary | ICD-10-CM | POA: Diagnosis not present

## 2021-05-23 DIAGNOSIS — H6123 Impacted cerumen, bilateral: Secondary | ICD-10-CM | POA: Diagnosis not present

## 2021-05-23 DIAGNOSIS — R0981 Nasal congestion: Secondary | ICD-10-CM | POA: Diagnosis not present

## 2021-08-22 ENCOUNTER — Ambulatory Visit: Payer: PPO | Admitting: Physician Assistant

## 2021-09-08 ENCOUNTER — Encounter: Payer: Self-pay | Admitting: Cardiovascular Disease

## 2021-09-08 ENCOUNTER — Ambulatory Visit: Payer: PPO | Admitting: Cardiovascular Disease

## 2021-09-08 VITALS — BP 130/88 | HR 50 | Ht 72.0 in | Wt 193.0 lb

## 2021-09-08 DIAGNOSIS — I2510 Atherosclerotic heart disease of native coronary artery without angina pectoris: Secondary | ICD-10-CM | POA: Diagnosis not present

## 2021-09-08 DIAGNOSIS — I25110 Atherosclerotic heart disease of native coronary artery with unstable angina pectoris: Secondary | ICD-10-CM

## 2021-09-08 DIAGNOSIS — J309 Allergic rhinitis, unspecified: Secondary | ICD-10-CM | POA: Diagnosis not present

## 2021-09-08 DIAGNOSIS — E7800 Pure hypercholesterolemia, unspecified: Secondary | ICD-10-CM | POA: Diagnosis not present

## 2021-09-08 DIAGNOSIS — Z125 Encounter for screening for malignant neoplasm of prostate: Secondary | ICD-10-CM | POA: Diagnosis not present

## 2021-09-08 DIAGNOSIS — Z0000 Encounter for general adult medical examination without abnormal findings: Secondary | ICD-10-CM | POA: Diagnosis not present

## 2021-09-08 DIAGNOSIS — E782 Mixed hyperlipidemia: Secondary | ICD-10-CM | POA: Diagnosis not present

## 2021-09-08 MED ORDER — NITROGLYCERIN 0.4 MG SL SUBL: 0.4000 mg | SUBLINGUAL_TABLET | SUBLINGUAL | 3 refills | Status: DC | PRN

## 2021-09-08 NOTE — Assessment & Plan Note (Signed)
History of CAD status post inferior STEMI 06/10/2011.  I performed PCI and stenting of his distal RCA with a Promus element drug-eluting stent (2.5 x 16 mm).  He did have a 90% stenosis in the proximal acute marginal branch of the RCA with an EF of 50% at that time with mild inferoapical hypokinesia.  He was admitted with unstable angina and underwent repeat cardiac catheterization by Dr. Angelena Form 05/23/2015 revealing unchanged anatomy.  He is on dual antiplatelet therapy and complains of excessive bruising.  I think it safe at this point to stop his clopidogrel.  He denies chest pain or shortness of breath. ?

## 2021-09-08 NOTE — Assessment & Plan Note (Signed)
History of hyperlipidemia on statin therapy followed by his PCP 

## 2021-09-08 NOTE — Patient Instructions (Signed)
Medication Instructions:  ?STOP: Plavix 75 mg daily ? ?*If you need a refill on your cardiac medications before your next appointment, please call your pharmacy* ? ? ?Lab Work: ?None ordered today ? ? ?Testing/Procedures: ?None ordered today ? ? ?Follow-Up: ?At [facility], you and your health needs are our priority.  As part of our continuing mission to provide you with exceptional heart care, we have created designated Provider Care Teams.  These Care Teams include your primary Cardiologist (physician) and Advanced Practice Providers (APPs -  Physician Assistants and Nurse Practitioners) who all work together to provide you with the care you need, when you need it. ? ?We recommend signing up for the patient portal called "MyChart".  Sign up information is provided on this After Visit Summary.  MyChart is used to connect with patients for Virtual Visits (Telemedicine).  Patients are able to view lab/test results, encounter notes, upcoming appointments, etc.  Non-urgent messages can be sent to your provider as well.   ?To learn more about what you can do with MyChart, go to [email or web address].   ? ?Your next appointment:   ?1 year(s) ? ?The format for your next appointment:   ?In Person ? ?Provider:   ?Quay Burow, MD { ? ? ?Important Information About Sugar ? ? ? ? ? ? ?

## 2021-09-08 NOTE — Progress Notes (Signed)
? ? ? ?09/08/2021 ?Derenda Fennel   ?12-14-1950  ?[id number] ? ?Primary Physician Alroy Dust, L.Marlou Sa, MD ?Primary Cardiologist: Lorretta Harp MD Lupe Carney, [place] ? ?HPI:  PEGGY MONK is a 71 y.o.    fit-appearing married Caucasian male, father of 2, grandfather to 2 grandchildren, who works as a Secondary school teacher. 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 He is status post acute inferior-wall myocardial infarction 06/10/11 which I treated with PCI and stenting of his distal dominant RCA with a Promus Element drug-eluting stent (2.5 x 16 mm long). He did have 90% stenosis in the proximal acute marginal branch of the RCA with an EF of 50% and mild inferoapical hypokinesia. His peak CPK was 926 with an MB of 48 and a troponin of 15. He has had no recurrent symptoms. Echo performed August 23, 2011, was entirely normal and a Myoview showed apical and septal scar without ischemia. He was admitted to [facility] on 05/23/15 with chest pain. He ruled out for myocardial infarction. He underwent cardiac catheterization by Dr. Angelena Form revealing unchanged anatomy with a patent distal RCA stent, scattered disease otherwise with a total mid LAD which was old, left left collaterals and normal LV function. His echo was normal. His d-dimer was negative and he was discharged home the same day. ?  ?Since I saw him a year ago he has been asymptomatic specifically denying chest pain or shortness of breath .  He does complain of excessive bruising when he bumps into anything.  He remains on dual antiplatelet therapy. ?  ? ? ?Current Meds  ?Medication Sig  ? aspirin EC 81 MG tablet Take 81 mg by mouth daily.  ? atorvastatin (LIPITOR) 80 MG tablet Take 1 tablet (80 mg total) by mouth daily.  ? fexofenadine (ALLEGRA ALLERGY) 180 MG tablet   ? metoprolol tartrate (LOPRESSOR) 25 MG tablet TAKE 1/2 TABLET(12.5 MG) BY MOUTH TWICE DAILY  ? nitroGLYCERIN (NITROSTAT) 0.4 MG SL tablet Place 1 tablet (0.4 mg total) under the tongue every 5 (five)  minutes as needed for chest pain.  ? [DISCONTINUED] clopidogrel (PLAVIX) 75 MG tablet TAKE 1 TABLET(75 MG) BY MOUTH DAILY  ?  ? ?No Known Allergies ? ?Social History  ? ?Socioeconomic History  ? Marital status: Married  ?  Spouse name: Not on file  ? Number of children: Not on file  ? Years of education: Not on file  ? Highest education level: Not on file  ?Occupational History  ? Not on file  ?Tobacco Use  ? Smoking status: Never  ? Smokeless tobacco: Never  ?Substance and Sexual Activity  ? Alcohol use: No  ? Drug use: No  ? Sexual activity: Yes  ?Other Topics Concern  ? Not on file  ?Social History Narrative  ? Not on file  ? ?Social Determinants of Health  ? ?Financial Resource Strain: Not on file  ?Food Insecurity: Not on file  ?Transportation Needs: Not on file  ?Physical Activity: Not on file  ?Stress: Not on file  ?Social Connections: Not on file  ?Intimate Partner Violence: Not on file  ?  ? ?Review of Systems: ?General: negative for chills, fever, night sweats or weight changes.  ?Cardiovascular: negative for chest pain, dyspnea on exertion, edema, orthopnea, palpitations, paroxysmal nocturnal dyspnea or shortness of breath ?Dermatological: negative for rash ?Respiratory: negative for cough or wheezing ?Urologic: negative for hematuria ?Abdominal: negative for nausea, vomiting, diarrhea, bright red blood per rectum, melena, or hematemesis ?Neurologic: negative for visual changes,  syncope, or dizziness ?All other systems reviewed and are otherwise negative except as noted above. ? ? ? ?Blood pressure 130/88, pulse (!) 50, height 6' (1.829 m), weight 193 lb (87.5 kg), SpO2 96 %.  ?General appearance: alert and no distress ?Neck: no adenopathy, no carotid bruit, no JVD, supple, symmetrical, trachea midline, and thyroid not enlarged, symmetric, no tenderness/mass/nodules ?Lungs: clear to auscultation bilaterally ?Heart: regular rate and rhythm, S1, S2 normal, no murmur, click, rub or gallop ?Extremities:  extremities normal, atraumatic, no cyanosis or edema ?Pulses: 2+ and symmetric ?Skin: Skin color, texture, turgor normal. No rashes or lesions ?Neurologic: Grossly normal ? ?EKG sinus bradycardia 50 with voltage criteria for LVH.  I personally reviewed this EKG. ? ?ASSESSMENT AND PLAN:  ? ?CAD- DMI RCA DES 06/10/11- residual 40-50% CFX, 30-40% LAD ?History of CAD status post inferior STEMI 06/10/2011.  I performed PCI and stenting of his distal RCA with a Promus element drug-eluting stent (2.5 x 16 mm).  He did have a 90% stenosis in the proximal acute marginal branch of the RCA with an EF of 50% at that time with mild inferoapical hypokinesia.  He was admitted with unstable angina and underwent repeat cardiac catheterization by Dr. Angelena Form 05/23/2015 revealing unchanged anatomy.  He is on dual antiplatelet therapy and complains of excessive bruising.  I think it safe at this point to stop his clopidogrel.  He denies chest pain or shortness of breath. ? ?Hyperlipidemia ?History of hyperlipidemia on statin therapy followed by his PCP. ? ? ? ? ?Lorretta Harp MD FACP,FACC,FAHA, FSCAI ?09/08/2021 ?9:37 AM ?

## 2021-09-08 NOTE — Addendum Note (Signed)
Addended by: Meryl Crutch on: 09/08/2021 10:02 AM ? ? Modules accepted: Orders ? ?

## 2021-09-19 DIAGNOSIS — M7130 Other bursal cyst, unspecified site: Secondary | ICD-10-CM | POA: Diagnosis not present

## 2021-09-19 DIAGNOSIS — Z87898 Personal history of other specified conditions: Secondary | ICD-10-CM | POA: Diagnosis not present

## 2021-09-19 DIAGNOSIS — L821 Other seborrheic keratosis: Secondary | ICD-10-CM | POA: Diagnosis not present

## 2021-09-19 DIAGNOSIS — L570 Actinic keratosis: Secondary | ICD-10-CM | POA: Diagnosis not present

## 2021-09-19 DIAGNOSIS — L578 Other skin changes due to chronic exposure to nonionizing radiation: Secondary | ICD-10-CM | POA: Diagnosis not present

## 2021-09-19 DIAGNOSIS — D225 Melanocytic nevi of trunk: Secondary | ICD-10-CM | POA: Diagnosis not present

## 2021-09-19 DIAGNOSIS — L814 Other melanin hyperpigmentation: Secondary | ICD-10-CM | POA: Diagnosis not present

## 2021-10-05 ENCOUNTER — Other Ambulatory Visit: Payer: Self-pay | Admitting: Cardiovascular Disease

## 2022-01-17 ENCOUNTER — Telehealth: Payer: Self-pay | Admitting: Cardiovascular Disease

## 2022-01-17 NOTE — Telephone Encounter (Signed)
*  STAT* If patient is at the pharmacy, call can be transferred to refill team.   1. Which medications need to be refilled? (please list name of each medication and dose if known) new prescription for Atorvastatin 80 mg  2. Which pharmacy/location (including street and city if local pharmacy) is medication to be sent to? [place], [place]  3. Do they need a 30 day or 90 day supply? 90 and refills

## 2022-01-18 MED ORDER — ATORVASTATIN CALCIUM 80 MG PO TABS: 80.0000 mg | ORAL_TABLET | Freq: Every day | ORAL | 2 refills | Status: DC

## 2022-04-10 DIAGNOSIS — H2513 Age-related nuclear cataract, bilateral: Secondary | ICD-10-CM | POA: Diagnosis not present

## 2022-04-10 DIAGNOSIS — Z9889 Other specified postprocedural states: Secondary | ICD-10-CM | POA: Diagnosis not present

## 2022-08-27 DIAGNOSIS — L814 Other melanin hyperpigmentation: Secondary | ICD-10-CM | POA: Diagnosis not present

## 2022-08-27 DIAGNOSIS — D485 Neoplasm of uncertain behavior of skin: Secondary | ICD-10-CM | POA: Diagnosis not present

## 2022-08-27 DIAGNOSIS — L821 Other seborrheic keratosis: Secondary | ICD-10-CM | POA: Diagnosis not present

## 2022-08-27 DIAGNOSIS — M7130 Other bursal cyst, unspecified site: Secondary | ICD-10-CM | POA: Diagnosis not present

## 2022-08-27 DIAGNOSIS — L578 Other skin changes due to chronic exposure to nonionizing radiation: Secondary | ICD-10-CM | POA: Diagnosis not present

## 2022-08-27 DIAGNOSIS — D225 Melanocytic nevi of trunk: Secondary | ICD-10-CM | POA: Diagnosis not present

## 2022-08-27 DIAGNOSIS — Z87898 Personal history of other specified conditions: Secondary | ICD-10-CM | POA: Diagnosis not present

## 2022-08-27 DIAGNOSIS — L570 Actinic keratosis: Secondary | ICD-10-CM | POA: Diagnosis not present

## 2022-09-11 DIAGNOSIS — Z0000 Encounter for general adult medical examination without abnormal findings: Secondary | ICD-10-CM | POA: Diagnosis not present

## 2022-09-11 DIAGNOSIS — E7800 Pure hypercholesterolemia, unspecified: Secondary | ICD-10-CM | POA: Diagnosis not present

## 2022-09-11 DIAGNOSIS — J309 Allergic rhinitis, unspecified: Secondary | ICD-10-CM | POA: Diagnosis not present

## 2022-09-11 DIAGNOSIS — Z125 Encounter for screening for malignant neoplasm of prostate: Secondary | ICD-10-CM | POA: Diagnosis not present

## 2022-09-11 DIAGNOSIS — Z23 Encounter for immunization: Secondary | ICD-10-CM | POA: Diagnosis not present

## 2022-09-11 DIAGNOSIS — I2510 Atherosclerotic heart disease of native coronary artery without angina pectoris: Secondary | ICD-10-CM | POA: Diagnosis not present

## 2022-09-11 LAB — LAB REPORT - SCANNED: EGFR: 59

## 2022-09-13 DIAGNOSIS — D485 Neoplasm of uncertain behavior of skin: Secondary | ICD-10-CM | POA: Diagnosis not present

## 2022-09-13 DIAGNOSIS — L570 Actinic keratosis: Secondary | ICD-10-CM | POA: Diagnosis not present

## 2022-09-30 ENCOUNTER — Other Ambulatory Visit: Payer: Self-pay | Admitting: Cardiovascular Disease

## 2022-10-12 ENCOUNTER — Other Ambulatory Visit: Payer: Self-pay | Admitting: Cardiovascular Disease

## 2022-10-17 DIAGNOSIS — L570 Actinic keratosis: Secondary | ICD-10-CM | POA: Diagnosis not present

## 2022-10-18 ENCOUNTER — Encounter: Payer: Self-pay | Admitting: Cardiovascular Disease

## 2022-10-18 ENCOUNTER — Ambulatory Visit: Payer: PPO | Attending: Cardiovascular Disease | Admitting: Cardiovascular Disease

## 2022-10-18 VITALS — BP 144/86 | HR 52 | Ht 71.0 in | Wt 195.2 lb

## 2022-10-18 DIAGNOSIS — E782 Mixed hyperlipidemia: Secondary | ICD-10-CM | POA: Diagnosis not present

## 2022-10-18 DIAGNOSIS — I2510 Atherosclerotic heart disease of native coronary artery without angina pectoris: Secondary | ICD-10-CM

## 2022-10-18 NOTE — Progress Notes (Signed)
10/18/2022 Dylan Johnson   Dylan 09, 1952  [id number]  Primary Physician [name], L.Dylan Saucer, MD Primary Cardiologist: Runell Gess MD Nicholes Calamity, [place]  HPI:  Dylan Johnson is a 72 y.o.    fit-appearing married Caucasian male, father of 2, grandfather to 2 grandchildren, who works as a Investment banker, corporate. I last saw him 09/08/2021.  Unfortunately, his wife has stage IV breast cancer was which limits his activity level.  He is status post acute inferior-wall myocardial infarction 06/10/11 which I treated with PCI and stenting of his distal dominant RCA with a Promus Element drug-eluting stent (2.5 x 16 mm long). He did have 90% stenosis in the proximal acute marginal branch of the RCA with an EF of 50% and mild inferoapical hypokinesia. His peak CPK was 926 with an MB of 48 and a troponin of 15. He has had no recurrent symptoms. Echo performed August 23, 2011, was entirely normal and a Myoview showed apical and septal scar without ischemia. He was admitted to [facility] on 05/23/15 with chest pain. He ruled out for myocardial infarction. He underwent cardiac catheterization by Dr. Clifton James revealing unchanged anatomy with a patent distal RCA stent, scattered disease otherwise with a total mid LAD which was old, left left collaterals and normal LV function. His echo was normal. His d-dimer was negative and he was discharged home the same day.   Since I saw him a year ago he has been asymptomatic specifically denying chest pain or shortness of breath  He remains on dual antiplatelet therapy.  His most recent lipid profile performed 09/11/2022 revealed total cholesterol 144, LDL 96 and HDL 32.   Current Meds  Medication Sig   aspirin EC 81 MG tablet Take 81 mg by mouth daily.   atorvastatin (LIPITOR) 80 MG tablet TAKE 1 TABLET(80 MG) BY MOUTH DAILY   fexofenadine (ALLEGRA ALLERGY) 180 MG tablet    metoprolol tartrate (LOPRESSOR) 25 MG tablet TAKE 1/2 TABLET(12.5 MG) BY MOUTH TWICE  DAILY   nitroGLYCERIN (NITROSTAT) 0.4 MG SL tablet Place 1 tablet (0.4 mg total) under the tongue every 5 (five) minutes as needed for chest pain.     No Known Allergies  Social History   Socioeconomic History   Marital status: Married    Spouse name: Not on file   Number of children: Not on file   Years of education: Not on file   Highest education level: Not on file  Occupational History   Not on file  Tobacco Use   Smoking status: Never   Smokeless tobacco: Never  Substance and Sexual Activity   Alcohol use: No   Drug use: No   Sexual activity: Yes  Other Topics Concern   Not on file  Social History Narrative   Not on file   Social Determinants of Health   Financial Resource Strain: Not on file  Food Insecurity: Not on file  Transportation Needs: Not on file  Physical Activity: Not on file  Stress: Not on file  Social Connections: Not on file  Intimate Partner Violence: Not on file     Review of Systems: General: negative for chills, fever, night sweats or weight changes.  Cardiovascular: negative for chest pain, dyspnea on exertion, edema, orthopnea, palpitations, paroxysmal nocturnal dyspnea or shortness of breath Dermatological: negative for rash Respiratory: negative for cough or wheezing Urologic: negative for hematuria Abdominal: negative for nausea, vomiting, diarrhea, bright red blood per rectum, melena, or hematemesis Neurologic: negative for visual  changes, syncope, or dizziness All other systems reviewed and are otherwise negative except as noted above.    Blood pressure (!) 144/86, pulse (!) 52, height 5\' 11"  (1.803 m), weight 195 lb 3.2 oz (88.5 kg), SpO2 98 %.  General appearance: alert and no distress Neck: no adenopathy, no carotid bruit, no JVD, supple, symmetrical, trachea midline, and thyroid not enlarged, symmetric, no tenderness/mass/nodules Lungs: clear to auscultation bilaterally Heart: regular rate and rhythm, S1, S2 normal, no murmur,  click, rub or gallop Extremities: extremities normal, atraumatic, no cyanosis or edema Pulses: 2+ and symmetric Skin: Skin color, texture, turgor normal. No rashes or lesions Neurologic: Grossly normal  EKG sinus bradycardia at 52 without ST or T wave changes.  I personally reviewed this EKG.  ASSESSMENT AND PLAN:   CAD- DMI RCA DES 06/10/11- residual 40-50% CFX, 30-40% LAD History of CAD status post inferior STEMI 06/10/2011.  Placed a drug-eluting stent to distal RCA (Promus, 2.5 x 60 mm long).  He did have a 90% stenosis in the proximal acute marginal branch of the RCA with an EF of 50% and mild inferoapical hypokinesia.  His CPK was 926 with an MB of 48 and troponin of 15.  He was recatheterized by Dr. Clifton James 05/23/2015 revealing unchanged anatomy with a total mid LAD which was old, left to left collaterals and normal LV function.  He denies chest pain or shortness of breath.  Hyperlipidemia History of hyperlipidemia on high-dose atorvastatin with lipid profile performed 09/11/2022 revealing total cholesterol 144, LDL 96 and HDL 32, not at goal for secondary prevention.  We talked about adding Zetia versus diet and exercise which he prefers.  We will recheck a lipid liver profile 3 months.     Runell Gess MD FACP,FACC,FAHA, [facility] 10/18/2022 1:25 PM

## 2022-10-18 NOTE — Assessment & Plan Note (Signed)
History of hyperlipidemia on high-dose atorvastatin with lipid profile performed 09/11/2022 revealing total cholesterol 144, LDL 96 and HDL 32, not at goal for secondary prevention.  We talked about adding Zetia versus diet and exercise which he prefers.  We will recheck a lipid liver profile 3 months.

## 2022-10-18 NOTE — Assessment & Plan Note (Signed)
History of CAD status post inferior STEMI 06/10/2011.  Placed a drug-eluting stent to distal RCA (Promus, 2.5 x 60 mm long).  He did have a 90% stenosis in the proximal acute marginal branch of the RCA with an EF of 50% and mild inferoapical hypokinesia.  His CPK was 926 with an MB of 48 and troponin of 15.  He was recatheterized by Dr. Clifton James 05/23/2015 revealing unchanged anatomy with a total mid LAD which was old, left to left collaterals and normal LV function.  He denies chest pain or shortness of breath.

## 2022-10-18 NOTE — Patient Instructions (Signed)
Medication Instructions:  The current medical regimen is effective;  continue present plan and medications.  *If you need a refill on your cardiac medications before your next appointment, please call your pharmacy*   Lab Work: LIPID/LIVER in 3 months, no lab appointment needed, come back fasting- nothing to eat or drink.  If you have labs (blood work) drawn today and your tests are completely normal, you will receive your results only by: MyChart Message (if you have MyChart) OR A paper copy in the mail If you have any lab test that is abnormal or we need to change your treatment, we will call you to review the results.   Follow-Up: At [facility], you and your health needs are our priority.  As part of our continuing mission to provide you with exceptional heart care, we have created designated Provider Care Teams.  These Care Teams include your primary Cardiologist (physician) and Advanced Practice Providers (APPs -  Physician Assistants and Nurse Practitioners) who all work together to provide you with the care you need, when you need it.  We recommend signing up for the patient portal called "MyChart".  Sign up information is provided on this After Visit Summary.  MyChart is used to connect with patients for Virtual Visits (Telemedicine).  Patients are able to view lab/test results, encounter notes, upcoming appointments, etc.  Non-urgent messages can be sent to your provider as well.   To learn more about what you can do with MyChart, go to [email or web address].    Your next appointment:   12 month(s)  Provider:   Nanetta Batty, MD

## 2022-11-13 ENCOUNTER — Ambulatory Visit: Payer: PPO | Admitting: Cardiovascular Disease

## 2022-12-07 ENCOUNTER — Other Ambulatory Visit: Payer: Self-pay | Admitting: Cardiovascular Disease

## 2023-01-15 DIAGNOSIS — E782 Mixed hyperlipidemia: Secondary | ICD-10-CM | POA: Diagnosis not present

## 2023-02-18 ENCOUNTER — Telehealth: Payer: Self-pay | Admitting: Cardiovascular Disease

## 2023-02-18 NOTE — Telephone Encounter (Signed)
*  STAT* If patient is at the pharmacy, call can be transferred to refill team.   1. Which medications need to be refilled? (please list name of each medication and dose if known) Atorvastatin   2. Would you like to learn more about the convenience, safety, & potential cost savings by using the [facility]?     3. Are you open to using the [facility] (Type [facility].     4. Which pharmacy/location (including street and city if local pharmacy) is medication to be sent to? [facility] [address] [place] , [place]   5. Do they need a 30 day or 90 day supply?  90 days and refills

## 2023-02-19 MED ORDER — ATORVASTATIN CALCIUM 80 MG PO TABS: 80.0000 mg | ORAL_TABLET | Freq: Every day | ORAL | 3 refills | Status: DC

## 2023-03-07 DIAGNOSIS — L300 Nummular dermatitis: Secondary | ICD-10-CM | POA: Diagnosis not present

## 2023-03-07 DIAGNOSIS — L0100 Impetigo, unspecified: Secondary | ICD-10-CM | POA: Diagnosis not present

## 2023-04-16 DIAGNOSIS — H2513 Age-related nuclear cataract, bilateral: Secondary | ICD-10-CM | POA: Diagnosis not present

## 2023-04-16 DIAGNOSIS — Z9889 Other specified postprocedural states: Secondary | ICD-10-CM | POA: Diagnosis not present

## 2023-04-16 DIAGNOSIS — H21233 Degeneration of iris (pigmentary), bilateral: Secondary | ICD-10-CM | POA: Diagnosis not present

## 2023-09-11 DIAGNOSIS — L570 Actinic keratosis: Secondary | ICD-10-CM | POA: Diagnosis not present

## 2023-09-11 DIAGNOSIS — L821 Other seborrheic keratosis: Secondary | ICD-10-CM | POA: Diagnosis not present

## 2023-09-11 DIAGNOSIS — L814 Other melanin hyperpigmentation: Secondary | ICD-10-CM | POA: Diagnosis not present

## 2023-09-11 DIAGNOSIS — L219 Seborrheic dermatitis, unspecified: Secondary | ICD-10-CM | POA: Diagnosis not present

## 2023-09-11 DIAGNOSIS — W57XXXA Bitten or stung by nonvenomous insect and other nonvenomous arthropods, initial encounter: Secondary | ICD-10-CM | POA: Diagnosis not present

## 2023-09-11 DIAGNOSIS — Z87898 Personal history of other specified conditions: Secondary | ICD-10-CM | POA: Diagnosis not present

## 2023-09-11 DIAGNOSIS — L578 Other skin changes due to chronic exposure to nonionizing radiation: Secondary | ICD-10-CM | POA: Diagnosis not present

## 2023-09-11 DIAGNOSIS — D225 Melanocytic nevi of trunk: Secondary | ICD-10-CM | POA: Diagnosis not present

## 2023-09-11 DIAGNOSIS — S30861A Insect bite (nonvenomous) of abdominal wall, initial encounter: Secondary | ICD-10-CM | POA: Diagnosis not present

## 2023-09-16 DIAGNOSIS — Z0189 Encounter for other specified special examinations: Secondary | ICD-10-CM | POA: Diagnosis not present

## 2023-09-16 DIAGNOSIS — Z1159 Encounter for screening for other viral diseases: Secondary | ICD-10-CM | POA: Diagnosis not present

## 2023-09-16 DIAGNOSIS — I2510 Atherosclerotic heart disease of native coronary artery without angina pectoris: Secondary | ICD-10-CM | POA: Diagnosis not present

## 2023-09-16 DIAGNOSIS — Z0000 Encounter for general adult medical examination without abnormal findings: Secondary | ICD-10-CM | POA: Diagnosis not present

## 2023-09-16 DIAGNOSIS — E7800 Pure hypercholesterolemia, unspecified: Secondary | ICD-10-CM | POA: Diagnosis not present

## 2023-09-16 DIAGNOSIS — J309 Allergic rhinitis, unspecified: Secondary | ICD-10-CM | POA: Diagnosis not present

## 2023-09-16 DIAGNOSIS — Z125 Encounter for screening for malignant neoplasm of prostate: Secondary | ICD-10-CM | POA: Diagnosis not present

## 2023-09-16 LAB — LAB REPORT - SCANNED: EGFR: 78

## 2023-09-26 ENCOUNTER — Ambulatory Visit: Payer: Self-pay

## 2023-10-15 ENCOUNTER — Other Ambulatory Visit: Payer: Self-pay | Admitting: Cardiovascular Disease

## 2023-11-25 ENCOUNTER — Telehealth: Payer: Self-pay | Admitting: Cardiovascular Disease

## 2023-11-25 MED ORDER — METOPROLOL TARTRATE 25 MG PO TABS: 12.5000 mg | ORAL_TABLET | Freq: Two times a day (BID) | ORAL | 0 refills | Status: DC

## 2023-11-25 NOTE — Telephone Encounter (Signed)
*  STAT* If patient is at the pharmacy, call can be transferred to refill team.   1. Which medications need to be refilled? (please list name of each medication and dose if known)   metoprolol  tartrate (LOPRESSOR ) 25 MG tablet    2. Which pharmacy/location (including street and city if local pharmacy) is medication to be sent to?  [id number] [place],  - [address]      3. Do they need a 30 day or 90 day supply? 90 day

## 2023-11-25 NOTE — Telephone Encounter (Signed)
 RX sent to requested Pharmacy

## 2023-12-06 ENCOUNTER — Other Ambulatory Visit: Payer: Self-pay | Admitting: Cardiovascular Disease

## 2023-12-17 ENCOUNTER — Emergency Department (HOSPITAL_COMMUNITY)

## 2023-12-17 ENCOUNTER — Other Ambulatory Visit: Payer: Self-pay

## 2023-12-17 ENCOUNTER — Emergency Department (HOSPITAL_COMMUNITY)
Admission: EM | Admit: 2023-12-17 | Discharge: 2023-12-17 | Disposition: A | Discharge status: Home / Self Care | Attending: Emergency Medicine | Admitting: Emergency Medicine

## 2023-12-17 ENCOUNTER — Encounter (HOSPITAL_COMMUNITY): Payer: Self-pay | Admitting: Emergency Medicine

## 2023-12-17 DIAGNOSIS — Z7982 Long term (current) use of aspirin: Secondary | ICD-10-CM | POA: Insufficient documentation

## 2023-12-17 DIAGNOSIS — R079 Chest pain, unspecified: Secondary | ICD-10-CM

## 2023-12-17 DIAGNOSIS — I2510 Atherosclerotic heart disease of native coronary artery without angina pectoris: Secondary | ICD-10-CM | POA: Insufficient documentation

## 2023-12-17 DIAGNOSIS — R0789 Other chest pain: Secondary | ICD-10-CM | POA: Diagnosis present

## 2023-12-17 LAB — CBC
HCT: 46.8 % (ref 39.0–52.0)
Hemoglobin: 14.8 g/dL (ref 13.0–17.0)
MCH: 28.8 pg (ref 26.0–34.0)
MCHC: 31.6 g/dL (ref 30.0–36.0)
MCV: 91.2 fL (ref 80.0–100.0)
Platelets: 212 K/uL (ref 150–400)
RBC: 5.13 MIL/uL (ref 4.22–5.81)
RDW: 13.3 % (ref 11.5–15.5)
WBC: 5.8 K/uL (ref 4.0–10.5)
nRBC: 0 % (ref 0.0–0.2)

## 2023-12-17 LAB — BASIC METABOLIC PANEL WITH GFR
Anion gap: 9 (ref 5–15)
BUN: 23 mg/dL (ref 8–23)
CO2: 23 mmol/L (ref 22–32)
Calcium: 8.9 mg/dL (ref 8.9–10.3)
Chloride: 107 mmol/L (ref 98–111)
Creatinine, Ser: 1.22 mg/dL (ref 0.61–1.24)
GFR, Estimated: >60 mL/min (ref >60)
Glucose, Bld: 99 mg/dL (ref 70–99)
Potassium: 4.5 mmol/L (ref 3.5–5.1)
Sodium: 139 mmol/L (ref 135–145)

## 2023-12-17 LAB — HEPATIC FUNCTION PANEL
ALT: 37 U/L (ref 0–44)
AST: 34 U/L (ref 15–41)
Albumin: 3.5 g/dL (ref 3.5–5.0)
Alkaline Phosphatase: 70 U/L (ref 38–126)
Bilirubin, Direct: 0.2 mg/dL (ref 0.0–0.2)
Indirect Bilirubin: 0.9 mg/dL (ref 0.3–0.9)
Total Bilirubin: 1.1 mg/dL (ref 0.0–1.2)
Total Protein: 6.6 g/dL (ref 6.5–8.1)

## 2023-12-17 LAB — TROPONIN I (HIGH SENSITIVITY)
Troponin I (High Sensitivity): 5 ng/L (ref <18)
Troponin I (High Sensitivity): 5 ng/L (ref <18)

## 2023-12-17 LAB — LIPASE, BLOOD: Lipase: 52 U/L — ABNORMAL HIGH (ref 11–51)

## 2023-12-17 MED ORDER — ASPIRIN 81 MG PO CHEW
324.0000 mg | CHEWABLE_TABLET | Freq: Once | ORAL | Status: AC
  Administered 2023-12-17: 324 mg via ORAL
  Filled 2023-12-17: qty 4

## 2023-12-17 MED ORDER — NITROGLYCERIN 0.4 MG SL SUBL: 0.4000 mg | SUBLINGUAL_TABLET | SUBLINGUAL | Status: DC | PRN

## 2023-12-17 NOTE — ED Provider Notes (Signed)
 Assumed care from Dr. Lorette.  Patient's delta troponin is negative they were 5 and 5, lipase and LFTs without significant findings.  On repeat evaluation patient is still having some tightness in his chest but he reports he has no shortness of breath, pain is worse with deep breathing and he has no abdominal pain.  He denies developing nausea or other symptoms.  He is currently renovating a house and reports that he thinks he may have pulled something.  Spoke with Programmer, multimedia and discussed the story.  Patient has follow-up with Dr. Wadie tomorrow at 11 AM.  They reported with a negative troponin x 2, no other associated symptoms and no exertional component to his pain they felt that he was stable for discharge home and follow-up tomorrow.  Discussed this with the patient.  He is comfortable with this plan.  He knows this if he develops exertional pain, shortness of breath, nausea or any other symptoms he should return to the emergency room immediately.   Doretha Folks, MD 12/17/23 [phone]

## 2023-12-17 NOTE — ED Triage Notes (Signed)
 Pt had MI in 2013. Tonight he wakes up around 0100 this morning but pain woke him up. Feels like some pressure and will not go away with any sort of position change. Denies nausea, SOB, dizziness, diaphoresis.

## 2023-12-17 NOTE — ED Notes (Signed)
 EDP at bedside

## 2023-12-17 NOTE — Discharge Instructions (Addendum)
 If there is anything about the chest pain that changes such as it starts getting worse with any type of activity, you develop shortness of breath, nausea, vomiting or the pain moves into your back arm or neck or any symptoms of feeling like you might pass out you should return to the emergency room immediately

## 2023-12-17 NOTE — ED Provider Notes (Signed)
 [place] EMERGENCY DEPARTMENT AT [facility] Provider Note   CSN: [id number] Arrival date & time: 12/17/23  9541     Patient presents with: Chest Pain   Dylan Johnson is a 73 y.o. male.  {Add pertinent medical, surgical, social history, OB history to HPI:32947} 73 yo M w/ h/o RCA occlusion s/p stent in 2013 who sees Dr. Court presents to the ED tonight with chest pressure. 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 Did have a mild mid scapular pain just prior to start working but didn't last more thana minute or two so let it be. Was fine throughout the day while working. Went to sleep. Woke up around 0100 with chest pressure and mild pain. No associated symptoms but this was how he presented with a heart attack 12 years ago. Tried changing position and was awoken again by chest pressure. Took his BP and was 161/85.  When he had his previous MI he also had elevated blood pressure which concerned him and brought him to the ER.  Patient's of some chest discomfort now.  Did not take nitroglycerin  as he had to drive.  Did not take baby aspirin  as it was not time for it yet also has not taken his morning metoprolol  dose yet.  Denies any shortness of breath, diaphoresis, vomiting, lightheadedness, change in skin color.  Has not been fatigued recently.  No recent illnesses such as fever, cough, diarrhea or constipation.  No sick contacts.  No trauma.  No worsening of pain with movement.  His most recent cath was in 2017 and he had no stentable targets but mild to moderate disease in multiple vessels along with occlusion of the LAD.  No recent stress test or catheterization.   Chest Pain      Prior to Admission medications   Medication Sig Start Date End Date Taking? Authorizing Provider  aspirin  EC 81 MG tablet Take 81 mg by mouth daily.    [provider]  atorvastatin  (LIPITOR) 80 MG tablet Take 1 tablet (80 mg total) by  mouth daily. 02/19/23   Court Dorn PARAS, MD  fexofenadine [facility] ALLERGY) 180 MG tablet  05/23/21   [provider]  metoprolol  tartrate (LOPRESSOR ) 25 MG tablet TAKE 0.5 TABLETS BY MOUTH 2 TIMES DAILY. 12/06/23   Court Dorn PARAS, MD  nitroGLYCERIN  (NITROSTAT ) 0.4 MG SL tablet Place 1 tablet (0.4 mg total) under the tongue every 5 (five) minutes as needed for chest pain. 09/08/21   Court Dorn PARAS, MD    Allergies: Patient has no known allergies.    Review of Systems  Cardiovascular:  Positive for chest pain.    Updated Vital Signs BP 137/78   Pulse (!) 53   Temp 97.8 F (36.6 C)   Resp 15   SpO2 98%   Physical Exam Vitals and nursing note reviewed.  Constitutional:      Appearance: He is well-developed.  HENT:     Head: Normocephalic and atraumatic.  Cardiovascular:     Rate and Rhythm: Normal rate.     Heart sounds: Normal heart sounds.  Pulmonary:     Effort: Pulmonary effort is normal. No respiratory distress.  Chest:     Chest wall: No tenderness.  Abdominal:     General: There is no distension.  Musculoskeletal:        General: Normal range of motion.     Cervical back: Normal range of motion.  Neurological:     Mental Status: He is alert.     (all labs ordered are listed, but only abnormal results are displayed) Labs Reviewed  BASIC METABOLIC PANEL WITH GFR  CBC  HEPATIC FUNCTION PANEL  LIPASE, BLOOD  TROPONIN I (HIGH SENSITIVITY)  TROPONIN I (HIGH SENSITIVITY)    EKG: EKG Interpretation Date/Time:  Tuesday December 17 2023 05:08:16 EDT Ventricular Rate:  59 PR Interval:  184 QRS Duration:  84 QT Interval:  434 QTC Calculation: 429 R Axis:   -2  Text Interpretation: Sinus bradycardia Otherwise normal ECG When compared with ECG of 11-Apr-2018 19:30, PREVIOUS ECG IS PRESENT Confirmed by Lorette Mayo [id number]) on 12/17/2023 5:30:51 AM  Radiology: [facility] Chest 2 View Result Date: 12/17/2023 CLINICAL DATA:  Chest pressure. EXAM: CHEST - 2 VIEW  COMPARISON:  04/11/2018 FINDINGS: The lungs are clear without focal pneumonia, edema, pneumothorax or pleural effusion. Cardiopericardial silhouette is at upper limits of normal for size. No acute bony abnormality. IMPRESSION: No active cardiopulmonary disease. Electronically Signed   By: Camellia Candle M.D.   On: 12/17/2023 05:43    {Document cardiac monitor, telemetry assessment procedure when appropriate:32947} Procedures   Medications Ordered in the ED  nitroGLYCERIN  (NITROSTAT ) SL tablet 0.4 mg (has no administration in time range)  aspirin  chewable tablet 324 mg (324 mg Oral Given 12/17/23 0634)      {Click here for ABCD2, HEART and other calculators REFRESH Note before signing:1}                              Medical Decision Making Amount and/or Complexity of Data Reviewed Labs: ordered. Radiology: ordered.  Risk OTC drugs. Prescription drug management.   Patient obviously at high risk for coronary pathology and this is the same way he presented when he had his MI.  He has known coronary artery disease not all of which has been stented.  EKG does not show anything ischemic and his first troponin is reassuring.  Will add on liver function labs and lipase although I think this is much less likely.  If patient still having pain we will give nitroglycerin  as long as blood pressure tolerates it.  If not able to get his pain controlled we will need to discuss cardiology although he has an appoint with Dr. Wadie tomorrow.  If his pain is controlled and troponins are negative and EKGs remained stable that patient's safe for discharge with his close cardiology follow-up.  {Document critical care time when appropriate  Document review of labs and clinical decision tools ie CHADS2VASC2, etc  Document your independent review of radiology images and any outside records  Document your discussion with family members, caretakers and with consultants  Document social determinants of health affecting  pt's care  Document your decision making why or why not admission, treatments were needed:32947:::1}   Final diagnoses:  None    ED Discharge Orders     None

## 2023-12-18 ENCOUNTER — Encounter: Payer: Self-pay | Admitting: Cardiovascular Disease

## 2023-12-18 ENCOUNTER — Ambulatory Visit: Attending: Cardiology | Admitting: Cardiovascular Disease

## 2023-12-18 VITALS — BP 132/88 | HR 60 | Ht 71.0 in | Wt 190.0 lb

## 2023-12-18 DIAGNOSIS — I2510 Atherosclerotic heart disease of native coronary artery without angina pectoris: Secondary | ICD-10-CM | POA: Diagnosis not present

## 2023-12-18 DIAGNOSIS — E782 Mixed hyperlipidemia: Secondary | ICD-10-CM | POA: Diagnosis not present

## 2023-12-18 NOTE — Progress Notes (Signed)
 12/18/2023 Dylan Johnson   05-24-1950  [id number]  Primary Physician Merilee, L.Addie, MD (Inactive) Primary Cardiologist: Dorn JINNY Lesches MD [facility]I [place], [place]  HPI:  Dylan Johnson is a 73 y.o.  fit-appearing married Caucasian male, father of 2, grandfather to 2 grandchildren, who works as a Investment banker, corporate. I last saw him 10/18/2022.  Unfortunately, his wife has stage IV breast cancer was which limits his activity level.  He is status post acute inferior-wall myocardial infarction 06/10/11 which I treated with PCI and stenting of his distal dominant RCA with a Promus Element drug-eluting stent (2.5 x 16 mm long). He did have 90% stenosis in the proximal acute marginal branch of the RCA with an EF of 50% and mild inferoapical hypokinesia. His peak CPK was 926 with an MB of 48 and a troponin of 15. He has had no recurrent symptoms. Echo performed August 23, 2011, was entirely normal and a Myoview showed apical and septal scar without ischemia he was admitted to [facility] on 05/23/15 with chest pain. He ruled out for myocardial infarction. He underwent cardiac catheterization by Dr. Verlin revealing unchanged anatomy with a patent distal RCA stent, scattered disease otherwise with a total mid LAD which was old, left left collaterals and normal LV function. His echo was normal. His d-dimer was negative and he was discharged home the same day.   Since I saw him a year ago he has been asymptomatic specifically denying chest pain or shortness of breath.  He was however seen in the ER yesterday with some atypical chest pain after climbing a pole with his grandchildren and doing some Holiday representative.  The workup was unrevealing.  The symptoms have since resolved.  They appear to have been musculoskeletal.  He is very active and plays pickle ball and walks without limitation.   Current Meds  Medication Sig   aspirin  EC 81 MG tablet Take 81 mg by mouth daily.   atorvastatin  (LIPITOR) 80  MG tablet Take 1 tablet (80 mg total) by mouth daily.   fexofenadine (ALLEGRA ALLERGY) 180 MG tablet    metoprolol  tartrate (LOPRESSOR ) 25 MG tablet TAKE 0.5 TABLETS BY MOUTH 2 TIMES DAILY.   nitroGLYCERIN  (NITROSTAT ) 0.4 MG SL tablet Place 1 tablet (0.4 mg total) under the tongue every 5 (five) minutes as needed for chest pain.     No Known Allergies  Social History   Socioeconomic History   Marital status: Married    Spouse name: Not on file   Number of children: Not on file   Years of education: Not on file   Highest education level: Not on file  Occupational History   Not on file  Tobacco Use   Smoking status: Never   Smokeless tobacco: Never  Substance and Sexual Activity   Alcohol use: No   Drug use: No   Sexual activity: Yes  Other Topics Concern   Not on file  Social History Narrative   Not on file   Social Drivers of Health   Financial Resource Strain: Not on file  Food Insecurity: Not on file  Transportation Needs: Not on file  Physical Activity: Not on file  Stress: Not on file  Social Connections: Not on file  Intimate Partner Violence: Not on file     Review of Systems: General: negative for chills, fever, night sweats or weight changes.  Cardiovascular: negative for chest pain, dyspnea on exertion, edema, orthopnea, palpitations, paroxysmal nocturnal dyspnea or shortness of  breath Dermatological: negative for rash Respiratory: negative for cough or wheezing Urologic: negative for hematuria Abdominal: negative for nausea, vomiting, diarrhea, bright red blood per rectum, melena, or hematemesis Neurologic: negative for visual changes, syncope, or dizziness All other systems reviewed and are otherwise negative except as noted above.    Blood pressure 132/88, pulse 60, height 5' 11 (1.803 m), weight 190 lb (86.2 kg), SpO2 98%.  General appearance: alert and no distress Neck: no adenopathy, no carotid bruit, no JVD, supple, symmetrical, trachea midline,  and thyroid not enlarged, symmetric, no tenderness/mass/nodules Lungs: clear to auscultation bilaterally Heart: regular rate and rhythm, S1, S2 normal, no murmur, click, rub or gallop Extremities: extremities normal, atraumatic, no cyanosis or edema Pulses: 2+ and symmetric Skin: Skin color, texture, turgor normal. No rashes or lesions Neurologic: Grossly normal  EKG not performed today      ASSESSMENT AND PLAN:   CAD- DMI RCA DES 06/10/11- residual 40-50% CFX, 30-40% LAD History of CAD status post inferior STEMI 06/10/2011.  I stented his distal dominant RCA with a Promus element drug-eluting stent (2.5 mm x 16 mm long.  He did have a 90% stenosis in the proximal acute marginal branch of the RCA with an EF of 50% mild inferoapical hypokinesia.  His peak MP was 48, troponin of 15.  Follow-up echo was entirely normal and Myoview showed apical and septal scar without ischemia.  He did have a repeat catheterization by Dr. Verlin 05/23/2015 revealing unchanged anatomy.  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  His workup was unrevealing.  The symptoms were musculoskeletal and they have since resolved.  Hyperlipidemia History of hyperlipidemia on statin therapy with lipid profile performed 09/16/2023 revealing total cholesterol 117, LDL 64 and HDL 41.     Dorn DOROTHA Lesches MD FACP,FACC,FAHA, [facility] 12/18/2023 11:19 AM

## 2023-12-18 NOTE — Assessment & Plan Note (Signed)
 History of CAD status post inferior STEMI 06/10/2011.  I stented his distal dominant RCA with a Promus element drug-eluting stent (2.5 mm x 16 mm long.  He did have a 90% stenosis in the proximal acute marginal branch of the RCA with an EF of 50% mild inferoapical hypokinesia.  His peak MP was 48, troponin of 15.  Follow-up echo was entirely normal and Myoview showed apical and septal scar without ischemia.  He did have a repeat catheterization by Dr. Verlin 05/23/2015 revealing unchanged anatomy.  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  His workup was unrevealing.  The symptoms were musculoskeletal and they have since resolved.

## 2023-12-18 NOTE — Assessment & Plan Note (Signed)
 History of hyperlipidemia on statin therapy with lipid profile performed 09/16/2023 revealing total cholesterol 117, LDL 64 and HDL 41.

## 2023-12-18 NOTE — Patient Instructions (Signed)

## 2023-12-20 ENCOUNTER — Telehealth: Payer: Self-pay | Admitting: Cardiovascular Disease

## 2023-12-20 ENCOUNTER — Other Ambulatory Visit: Payer: Self-pay

## 2023-12-20 MED ORDER — NITROGLYCERIN 0.4 MG SL SUBL: 0.4000 mg | SUBLINGUAL_TABLET | SUBLINGUAL | 3 refills | Status: DC | PRN

## 2023-12-20 NOTE — Telephone Encounter (Signed)
*  STAT* If patient is at the pharmacy, call can be transferred to refill team.   1. Which medications need to be refilled? (please list name of each medication and dose if known)   nitroGLYCERIN  (NITROSTAT ) 0.4 MG SL tablet   2. Would you like to learn more about the convenience, safety, & potential cost savings by using the [facility]?   3. Are you open to using the [facility] (Type [facility]. ).  4. Which pharmacy/location (including street and city if local pharmacy) is medication to be sent to?  [id number] [place], [place] - [address]  5. Do they need a 30 day or 90 day supply?   Patient stated his prescription has expired.

## 2024-02-15 ENCOUNTER — Other Ambulatory Visit: Payer: Self-pay | Admitting: Cardiovascular Disease

## 2024-03-16 DIAGNOSIS — D225 Melanocytic nevi of trunk: Secondary | ICD-10-CM | POA: Diagnosis not present

## 2024-03-16 DIAGNOSIS — L219 Seborrheic dermatitis, unspecified: Secondary | ICD-10-CM | POA: Diagnosis not present

## 2024-03-16 DIAGNOSIS — Z87898 Personal history of other specified conditions: Secondary | ICD-10-CM | POA: Diagnosis not present

## 2024-03-16 DIAGNOSIS — L814 Other melanin hyperpigmentation: Secondary | ICD-10-CM | POA: Diagnosis not present

## 2024-03-16 DIAGNOSIS — L578 Other skin changes due to chronic exposure to nonionizing radiation: Secondary | ICD-10-CM | POA: Diagnosis not present

## 2024-03-16 DIAGNOSIS — L821 Other seborrheic keratosis: Secondary | ICD-10-CM | POA: Diagnosis not present

## 2024-04-08 ENCOUNTER — Telehealth: Payer: Self-pay | Admitting: Cardiovascular Disease

## 2024-04-08 MED ORDER — NITROGLYCERIN 0.4 MG SL SUBL: 0.4000 mg | SUBLINGUAL_TABLET | SUBLINGUAL | 3 refills | Status: AC | PRN

## 2024-04-08 NOTE — Telephone Encounter (Signed)
 Rx sent to pharmacy

## 2024-04-08 NOTE — Telephone Encounter (Signed)
*  STAT* If patient is at the pharmacy, call can be transferred to refill team.   1. Which medications need to be refilled? (please list name of each medication and dose if known)   nitroGLYCERIN  (NITROSTAT ) 0.4 MG SL tablet     2. Would you like to learn more about the convenience, safety, & potential cost savings by using the [facility]? no   3. Are you open to using the [facility] (Type [facility].  ).no    4. Which pharmacy/location (including street and city if local pharmacy) is medication to be sent to? [id number] [place] [place], [place] - [address]     5. Do they need a 30 day or 90 day supply? 30 day   Pt is out of medication

## 2024-06-03 ENCOUNTER — Other Ambulatory Visit: Payer: Self-pay | Admitting: Cardiovascular Disease
